# Patient Record
Sex: Female | Born: 1965
Health system: Southern US, Community
[De-identification: ages and names within clinical notes are randomized; demographics above are authoritative.]

## PROBLEM LIST (undated history)

## (undated) DIAGNOSIS — E669 Obesity, unspecified: Secondary | ICD-10-CM

## (undated) DIAGNOSIS — Z9889 Other specified postprocedural states: Secondary | ICD-10-CM

## (undated) DIAGNOSIS — I10 Essential (primary) hypertension: Secondary | ICD-10-CM

## (undated) DIAGNOSIS — H269 Unspecified cataract: Secondary | ICD-10-CM

## (undated) DIAGNOSIS — I5189 Other ill-defined heart diseases: Secondary | ICD-10-CM

## (undated) DIAGNOSIS — E785 Hyperlipidemia, unspecified: Secondary | ICD-10-CM

## (undated) DIAGNOSIS — N921 Excessive and frequent menstruation with irregular cycle: Secondary | ICD-10-CM

## (undated) HISTORY — DX: Excessive and frequent menstruation with irregular cycle: N92.1

## (undated) HISTORY — DX: Hyperlipidemia, unspecified: E78.5

## (undated) HISTORY — DX: Other specified postprocedural states: Z98.890

## (undated) HISTORY — DX: Essential (primary) hypertension: I10

## (undated) HISTORY — DX: Other ill-defined heart diseases: I51.89

## (undated) HISTORY — DX: Obesity, unspecified: E66.9

## (undated) HISTORY — PX: WISDOM TOOTH EXTRACTION: SHX21

## (undated) HISTORY — PX: ABDOMINAL HYSTERECTOMY: SHX81

## (undated) HISTORY — DX: Unspecified cataract: H26.9

## (undated) HISTORY — PX: COLONOSCOPY: SHX174

---

## 1990-05-15 HISTORY — PX: TUBAL LIGATION: SHX77

## 1998-06-14 ENCOUNTER — Other Ambulatory Visit: Admission: RE | Admit: 1998-06-14 | Discharge: 1998-06-14 | Payer: Self-pay | Admitting: Obstetrics and Gynecology

## 1999-01-03 ENCOUNTER — Inpatient Hospital Stay (HOSPITAL_COMMUNITY): Admission: AD | Admit: 1999-01-03 | Discharge: 1999-01-06 | Payer: Self-pay | Admitting: Obstetrics and Gynecology

## 1999-01-03 ENCOUNTER — Encounter (INDEPENDENT_AMBULATORY_CARE_PROVIDER_SITE_OTHER): Payer: Self-pay

## 2001-05-20 ENCOUNTER — Other Ambulatory Visit: Admission: RE | Admit: 2001-05-20 | Discharge: 2001-05-20 | Payer: Self-pay | Admitting: Obstetrics and Gynecology

## 2002-06-24 ENCOUNTER — Other Ambulatory Visit: Admission: RE | Admit: 2002-06-24 | Discharge: 2002-06-24 | Payer: Self-pay | Admitting: Obstetrics and Gynecology

## 2003-07-17 ENCOUNTER — Other Ambulatory Visit: Admission: RE | Admit: 2003-07-17 | Discharge: 2003-07-17 | Payer: Self-pay | Admitting: Obstetrics and Gynecology

## 2004-07-21 ENCOUNTER — Other Ambulatory Visit: Admission: RE | Admit: 2004-07-21 | Discharge: 2004-07-21 | Payer: Self-pay | Admitting: Obstetrics and Gynecology

## 2005-08-14 ENCOUNTER — Other Ambulatory Visit: Admission: RE | Admit: 2005-08-14 | Discharge: 2005-08-14 | Payer: Self-pay | Admitting: Obstetrics and Gynecology

## 2010-01-12 ENCOUNTER — Ambulatory Visit (HOSPITAL_COMMUNITY): Admission: RE | Admit: 2010-01-12 | Discharge: 2010-01-13 | Payer: Self-pay | Admitting: Obstetrics and Gynecology

## 2010-01-12 ENCOUNTER — Encounter (INDEPENDENT_AMBULATORY_CARE_PROVIDER_SITE_OTHER): Payer: Self-pay | Admitting: Obstetrics and Gynecology

## 2010-01-27 ENCOUNTER — Inpatient Hospital Stay (HOSPITAL_COMMUNITY): Admission: AD | Admit: 2010-01-27 | Discharge: 2010-01-27 | Payer: Self-pay | Admitting: Obstetrics and Gynecology

## 2010-07-28 LAB — CBC
HCT: 41.4 % (ref 36.0–46.0)
MCH: 29.8 pg (ref 26.0–34.0)
MCV: 87.4 fL (ref 78.0–100.0)
MCV: 89.3 fL (ref 78.0–100.0)
Platelets: 196 10*3/uL (ref 150–400)
RBC: 4.74 MIL/uL (ref 3.87–5.11)
RDW: 12.5 % (ref 11.5–15.5)
WBC: 9.9 10*3/uL (ref 4.0–10.5)
WBC: 10.7 10*3/uL — ABNORMAL HIGH (ref 4.0–10.5)

## 2010-07-28 LAB — URINE MICROSCOPIC-ADD ON

## 2010-07-28 LAB — URINALYSIS, ROUTINE W REFLEX MICROSCOPIC
Nitrite: NEGATIVE
Protein, ur: NEGATIVE mg/dL
Urobilinogen, UA: 0.2 mg/dL (ref 0.0–1.0)

## 2010-07-28 LAB — URINE CULTURE
Colony Count: >=100000
Culture  Setup Time: 201109160142

## 2010-07-28 LAB — COMPREHENSIVE METABOLIC PANEL
Alkaline Phosphatase: 85 U/L (ref 39–117)
BUN: 10 mg/dL (ref 6–23)
CO2: 24 mEq/L (ref 19–32)
Chloride: 105 mEq/L (ref 96–112)
Creatinine, Ser: 0.86 mg/dL (ref 0.4–1.2)
GFR calc non Af Amer: >60 mL/min (ref >60)
Glucose, Bld: 93 mg/dL (ref 70–99)
Potassium: 3.4 mEq/L — ABNORMAL LOW (ref 3.5–5.1)
Total Bilirubin: 0.7 mg/dL (ref 0.3–1.2)

## 2010-07-29 LAB — SURGICAL PCR SCREEN
MRSA, PCR: NEGATIVE
Staphylococcus aureus: NEGATIVE

## 2010-07-29 LAB — CBC
Hemoglobin: 12.6 g/dL (ref 12.0–15.0)
MCHC: 34.2 g/dL (ref 30.0–36.0)
RDW: 12.5 % (ref 11.5–15.5)

## 2014-11-27 ENCOUNTER — Other Ambulatory Visit: Payer: Self-pay | Admitting: Obstetrics and Gynecology

## 2014-11-30 LAB — CYTOLOGY - PAP

## 2016-12-08 ENCOUNTER — Encounter: Payer: Self-pay | Admitting: Internal Medicine

## 2016-12-18 ENCOUNTER — Telehealth: Payer: Self-pay | Admitting: Physician Assistant

## 2016-12-18 ENCOUNTER — Encounter: Payer: Self-pay | Admitting: Physician Assistant

## 2016-12-18 NOTE — Telephone Encounter (Signed)
Called pt and left message asking pt to call back to update Fm and Medical Hx.

## 2016-12-19 ENCOUNTER — Encounter: Payer: Self-pay | Admitting: Physician Assistant

## 2017-01-02 ENCOUNTER — Encounter: Payer: Self-pay | Admitting: Physician Assistant

## 2017-01-02 NOTE — Progress Notes (Signed)
Cardiology Office Note    Date:  01/04/2017  ID:  Dawn Daniels, Dawn Daniels 1965-09-15, MRN [id number] PCP:  Donald Prose, MD  Cardiologist:  New, reviewed with Dr. Saunders Revel   Chief Complaint: chest discomfort  History of Present Illness:  Dawn Daniels is a 51 y.o. female with history of menometrorrhagia s/p hysterectomy, hyperlipidemia, obesity who is being seen today for the evaluation of chest pain at the request of Dr. Gaetano Net.  She has no prior cardiac history or family history of heart disease (although father's side is unknown). She smoked for 2 years as a teenager. She has been having this discomfort for about a year and a half. She was previously referred to cardiology last year but she deferred this due to busy schedule with her husband having surgery at the time. During this last year and a half, she has noticed that when she exerts herself, she feels a sensation of chest burning on the left side of her chest which she describes as a sensation of fluid moving up and down, as if it is rattling. This happens fairly early in exercise, essentially any time she gets her heart rate up. She's decreased her activity level due to this. The sensation improves with rest. She has not felt it at rest, with inspiration, anxiety, meals or lying down. She's not felt any palpitations. A month ago she had 3 weeks of worsening dyspnea in the setting of increased stress at work but that has resolved. No syncope, LEE, dizziness, bleeding. She has an Apple watch but has not paid particular attention to HR during activity. BP is running elevated today. It was 128/86 at recent OB appt. I do not have formal records but POC report says that labs in 12/2015 showed Tchol 248, trig 158, HDL 52, LDL 163, Cr 0.91, K 4.0, TSH 1.530.   Past Medical History:  Diagnosis Date  . Hyperlipidemia   . Menometrorrhagia    a. s/p hysterectomy.  . Obesity     Past Surgical History:  Procedure Laterality Date  . ABDOMINAL  HYSTERECTOMY    . CESAREAN SECTION  1999, 2000    Current Medications: Current Meds  Medication Sig  . Boswellia-Glucosamine-Vit D (GLUCOSAMINE COMPLEX PO) Take 2 tablets by mouth daily.  . TURMERIC PO Take 1 capsule by mouth daily.     Allergies:   Patient has no known allergies.   Social History   Social History  . Marital status: Married    Spouse name: N/A  . Number of children: 2  . Years of education: 12   Occupational History  . [employer]     Social History Main Topics  . Smoking status: Former Research scientist (life sciences)  . Smokeless tobacco: Never Used     Comment: Smoked for 2 years as a teenager  . Alcohol use No  . Drug use: No  . Sexual activity: Not Asked   Other Topics Concern  . None   Social History Narrative  . None     Family History:  Family History  Problem Relation Age of Onset  . Hypertension Mother   . Other Father        Father's history not known  . CAD Neg Hx     ROS:   Please see the history of present illness.  All other systems are reviewed and otherwise negative.    PHYSICAL EXAM:   VS:  BP (!) 150/94   Pulse 63   Ht 5' 6.5" (1.689 m)  Wt 198 lb (89.8 kg)   SpO2 96%   BMI 31.48 kg/m   BMI: Body mass index is 31.48 kg/m. GEN: Well nourished, well developed WF, in no acute distress  HEENT: normocephalic, atraumatic Neck: no JVD, carotid bruits, or masses Cardiac: RRR; no murmurs, rubs, or gallops, no edema  Respiratory:  clear to auscultation bilaterally, normal work of breathing GI: soft, nontender, nondistended, + BS MS: no deformity or atrophy  Skin: warm and dry, no rash Neuro:  Alert and Oriented x 3, Strength and sensation are intact, follows commands Psych: euthymic mood, full affect  Wt Readings from Last 3 Encounters:  01/04/17 198 lb (89.8 kg)      Studies/Labs Reviewed:   EKG:  EKG was ordered today and personally reviewed by me and demonstrates NSR 63bpm, TWI III, otherwise normal  Recent Labs: No results  found for requested labs within last 8760 hours.   Lipid Panel No results found for: CHOL, TRIG, HDL, CHOLHDL, VLDL, LDLCALC, LDLDIRECT  Additional studies/ records that were reviewed today include: Summarized above    ASSESSMENT & PLAN:   This patient's case was discussed in depth with Dr. Saunders Revel. The plan below was formulated per our discussion.  1. Chest discomfort - somewhat atypical in quality, but also with a burning characteristic occurring solely with exertion. Will plan on stress echocardiogram to evaluate. This will also allow [place] to assess blood pressure response to exercise and also rule out any exercise induced arrhythmias. Check CBC to exclude any anemia contributing to angina. Start ASA 81mg  daily as preventative measure. We discussed holding off on more strenuous activity for now while the above is evaluated. ER precautions reviewed. 2. Hyperlipidemia - being followed by PCP/OB for now. If stress test is abnormal, would consider initiation of statin. Will add baseline LFTs to labs in case this is warranted. 3. Elevated blood pressure reading in office without prior diagnosis of HTN - check baseline lytes/Cr in case this will require treatment. Check screening thyroid function. Per d/w MD, will follow-up BP when she comes in for her stress test to determine whether therapy needs to be initiated.  4. Obesity - she is aware she is overweight. Once cleared from a cardiac perspective, would encourage her to get back into regular exercise program. She used to go to the gym but has not been in the last year.  Disposition: F/u with myself after above testing.   Medication Adjustments/Labs and Tests Ordered: Current medicines are reviewed at length with the patient today.  Concerns regarding medicines are outlined above. Medication changes, Labs and Tests ordered today are summarized above and listed in the Patient Instructions accessible in Encounters.   Signed, Charlie Pitter, PA-C    01/04/2017 [facility] [place], [place], [place]  [address] Phone: [phone]; Fax: [phone]

## 2017-01-04 ENCOUNTER — Ambulatory Visit (INDEPENDENT_AMBULATORY_CARE_PROVIDER_SITE_OTHER): Payer: BLUE CROSS/BLUE SHIELD | Admitting: Physician Assistant

## 2017-01-04 ENCOUNTER — Encounter: Payer: Self-pay | Admitting: Physician Assistant

## 2017-01-04 ENCOUNTER — Encounter (INDEPENDENT_AMBULATORY_CARE_PROVIDER_SITE_OTHER): Payer: Self-pay

## 2017-01-04 VITALS — BP 150/94 | HR 63 | Ht 66.5 in | Wt 198.0 lb

## 2017-01-04 DIAGNOSIS — R0789 Other chest pain: Secondary | ICD-10-CM | POA: Diagnosis not present

## 2017-01-04 DIAGNOSIS — E669 Obesity, unspecified: Secondary | ICD-10-CM | POA: Diagnosis not present

## 2017-01-04 DIAGNOSIS — R030 Elevated blood-pressure reading, without diagnosis of hypertension: Secondary | ICD-10-CM

## 2017-01-04 DIAGNOSIS — E785 Hyperlipidemia, unspecified: Secondary | ICD-10-CM | POA: Diagnosis not present

## 2017-01-04 MED ORDER — ASPIRIN EC 81 MG PO TBEC: 81.0000 mg | DELAYED_RELEASE_TABLET | Freq: Every day | ORAL | 3 refills | Status: DC

## 2017-01-04 NOTE — Patient Instructions (Signed)
Medication Instructions:  Your physician has recommended you make the following change in your medication:  1.  START Aspirin 81 mg daily   Labwork: TODAY:  BMET, CBC, & TSH  Testing/Procedures: Your physician has requested that you have a stress echocardiogram. For further information please visit [email or web address]. Please follow instruction sheet as given.    Follow-Up: Your physician recommends that you schedule a follow-up appointment in: 1 WEEK AFTER ECHO IS DONE WITH DAYNA DUNN, PA-C   Any Other Special Instructions Will Be Listed Below (If Applicable).   Exercise Stress Echocardiogram An exercise stress echocardiogram is a test to check how well your heart is working. This test uses sound waves (ultrasound) and a computer to make images of your heart before and after exercise. Ultrasound images that are taken before you exercise (your resting echocardiogram) will show how much blood is getting to your heart muscle and how well your heart muscle and heart valves are functioning. During the next part of this test, you will walk on a treadmill or ride a stationary bike to see how exercise affects your heart. While you exercise, the electrical activity of your heart will be monitored with an electrocardiogram (ECG). Your blood pressure will also be monitored. You may have this test if you:  Have chest pain or other symptoms of a heart problem.  Recently had a heart attack or heart surgery.  Have heart valve problems.  Have a condition that causes narrowing of the blood vessels that supply your heart (coronary artery disease).  Have a high risk of heart disease and are starting a new exercise program.  Have a high risk of heart disease and need to have major surgery.  Tell a health care provider about:  Any allergies you have.  All medicines you are taking, including vitamins, herbs, eye drops, creams, and over-the-counter medicines.  Any problems you or family members  have had with anesthetic medicines.  Any blood disorders you have.  Any surgeries you have had.  Any medical conditions you have.  Whether you are pregnant or may be pregnant. What are the risks? Generally, this is a safe procedure. However, problems may occur, including:  Chest pain.  Dizziness or light-headedness.  Shortness of breath.  Increased or irregular heartbeat (palpitations).  Nausea or vomiting.  Heart attack (very rare).  What happens before the procedure?  Follow instructions from your health care provider about eating or drinking restrictions. You may be asked to avoid all forms of caffeine for 24 hours before your procedure, or as told by your health care provider.  Ask your health care provider about changing or stopping your regular medicines. This is especially important if you are taking diabetes medicines or blood thinners.  If you use an inhaler, bring it with you to the test.  Wear loose, comfortable clothing and walking shoes.  Do notuse any products that contain nicotine or tobacco, such as cigarettes and e-cigarettes, for 4 hours before the test or as told by your health care provider. If you need help quitting, ask your health care provider. What happens during the procedure?  You will take off your clothes from the waist up and put on a hospital gown.  A technician will place electrodes on your chest.  A blood pressure cuff will be placed on your arm.  You will lie down on a table for an ultrasound exam before you exercise. Gel will be rubbed on your chest, and a handheld device (transducer) will be pressed  against your chest and moved over your heart.  Then, you will start exercising by walking on a treadmill or pedaling a stationary bicycle.  Your blood pressure and heart rhythm will be monitored while you exercise.  The exercise will gradually get harder or faster.  You will exercise until: ? Your heart reaches a target level. ? You  are too tired to continue. ? You cannot continue because of chest pain, weakness, or dizziness.  You will have another ultrasound exam after you stop exercising. The procedure may vary among health care providers and hospitals. What happens after the procedure?  Your heart rate and blood pressure will be monitored until they return to your normal levels. Summary  An exercise stress echocardiogram is a test that uses ultrasound to check how well your heart works before and after exercise.  Before the test, follow instructions from your health care provider about stopping medications, avoiding nicotine and tobacco, and avoiding certain foods and drinks.  During the test, your blood pressure and heart rhythm will be monitored while you exercise on a treadmill or stationary bicycle. This information is not intended to replace advice given to you by your health care provider. Make sure you discuss any questions you have with your health care provider. Document Released: 05/05/2004 Document Revised: 12/22/2015 Document Reviewed: 12/22/2015 Elsevier Interactive Patient Education  [employer].    If you need a refill on your cardiac medications before your next appointment, please call your pharmacy.

## 2017-01-05 ENCOUNTER — Telehealth: Payer: Self-pay | Admitting: Physician Assistant

## 2017-01-05 LAB — BASIC METABOLIC PANEL
BUN/Creatinine Ratio: 18 (ref 9–23)
BUN: 16 mg/dL (ref 6–24)
CALCIUM: 9.6 mg/dL (ref 8.7–10.2)
CHLORIDE: 103 mmol/L (ref 96–106)
CO2: 24 mmol/L (ref 20–29)
CREATININE: 0.88 mg/dL (ref 0.57–1.00)
GFR calc Af Amer: 89 mL/min/{1.73_m2} (ref >59)
GFR calc non Af Amer: 77 mL/min/{1.73_m2} (ref >59)
GLUCOSE: 87 mg/dL (ref 65–99)
Potassium: 3.9 mmol/L (ref 3.5–5.2)
Sodium: 143 mmol/L (ref 134–144)

## 2017-01-05 LAB — TSH: TSH: 2.12 u[IU]/mL (ref 0.450–4.500)

## 2017-01-05 LAB — CBC
HEMATOCRIT: 41.3 % (ref 34.0–46.6)
HEMOGLOBIN: 13.8 g/dL (ref 11.1–15.9)
MCH: 29.1 pg (ref 26.6–33.0)
MCHC: 33.4 g/dL (ref 31.5–35.7)
MCV: 87 fL (ref 79–97)
Platelets: 281 10*3/uL (ref 150–379)
RBC: 4.74 x10E6/uL (ref 3.77–5.28)
RDW: 12.2 % — ABNORMAL LOW (ref 12.3–15.4)
WBC: 8.2 10*3/uL (ref 3.4–10.8)

## 2017-01-05 NOTE — Telephone Encounter (Signed)
Returned pts call and she has been made aware of her lab results. 

## 2017-01-05 NOTE — Telephone Encounter (Signed)
-----   Message from Charlie Pitter, [place] sent at 01/05/2017  8:54 AM EDT ----- Please let patient know labs were normal. Melina Copa PA-C

## 2017-01-05 NOTE — Telephone Encounter (Signed)
New Message     Pt returning [place] call for lab results

## 2017-01-17 ENCOUNTER — Telehealth: Payer: Self-pay | Admitting: *Deleted

## 2017-01-17 NOTE — Telephone Encounter (Signed)
Pt is scheduled for PV 9/18 and colonoscopy 10/2 with Dr Hilarie Fredrickson.  She has stress echocardiogram scheduled for 9/26.  Talked with patient; she will cancel colonoscopy and PV and reshcedule after results of echo have been reviewed by cardiologist and she has clearance from him

## 2017-02-01 ENCOUNTER — Telehealth (HOSPITAL_COMMUNITY): Payer: Self-pay | Admitting: *Deleted

## 2017-02-01 NOTE — Telephone Encounter (Signed)
Left message on voicemail per DPR in reference to upcoming appointment scheduled on 02/08/17 at 7:30 with detailed instructions given per Stress Test Requisition Sheet for the test. LM to arrive 30 minutes early, and that it is imperative to arrive on time for appointment to keep from having the test rescheduled. If you need to cancel or reschedule your appointment, please call the office within 24 hours of your appointment. Failure to do so may result in a cancellation of your appointment, and a $50 no show fee. Phone number given for call back for any questions. Dawn Daniels

## 2017-02-07 ENCOUNTER — Other Ambulatory Visit (HOSPITAL_COMMUNITY): Payer: BLUE CROSS/BLUE SHIELD

## 2017-02-07 ENCOUNTER — Ambulatory Visit (HOSPITAL_COMMUNITY): Payer: BLUE CROSS/BLUE SHIELD | Attending: Internal Medicine

## 2017-02-07 ENCOUNTER — Other Ambulatory Visit: Payer: Self-pay

## 2017-02-07 ENCOUNTER — Telehealth: Payer: Self-pay | Admitting: *Deleted

## 2017-02-07 DIAGNOSIS — R0789 Other chest pain: Secondary | ICD-10-CM | POA: Insufficient documentation

## 2017-02-07 DIAGNOSIS — I5030 Unspecified diastolic (congestive) heart failure: Secondary | ICD-10-CM | POA: Insufficient documentation

## 2017-02-07 NOTE — Telephone Encounter (Signed)
Echo scheduler requested for an order to be placed for this pt.  Pt was originally scheduled to have a stress echo done today, but insurance will not cover this, without an echo done first. Echo order placed for chest discomfort.  Pt will come as scheduled today, for echo appt. Melina Copa PA-C is ordering Provider.

## 2017-02-08 ENCOUNTER — Encounter: Payer: Self-pay | Admitting: Physician Assistant

## 2017-02-08 ENCOUNTER — Telehealth (HOSPITAL_COMMUNITY): Payer: Self-pay | Admitting: *Deleted

## 2017-02-08 NOTE — Telephone Encounter (Signed)
Patient given detailed instructions per Stress Test Requisition Sheet for test on 02/15/17 at 7:30.Patient Notified to arrive 30 minutes early, and that it is imperative to arrive on time for appointment to keep from having the test rescheduled.  Patient verbalized understanding. Dawn Daniels

## 2017-02-13 ENCOUNTER — Encounter: Payer: Self-pay | Admitting: Internal Medicine

## 2017-02-13 ENCOUNTER — Ambulatory Visit: Payer: BLUE CROSS/BLUE SHIELD | Admitting: Physician Assistant

## 2017-02-15 ENCOUNTER — Ambulatory Visit (HOSPITAL_BASED_OUTPATIENT_CLINIC_OR_DEPARTMENT_OTHER): Payer: BLUE CROSS/BLUE SHIELD

## 2017-02-15 ENCOUNTER — Ambulatory Visit (HOSPITAL_COMMUNITY): Payer: BLUE CROSS/BLUE SHIELD | Attending: Cardiology

## 2017-02-15 ENCOUNTER — Telehealth: Payer: Self-pay | Admitting: *Deleted

## 2017-02-15 DIAGNOSIS — R5383 Other fatigue: Secondary | ICD-10-CM | POA: Diagnosis not present

## 2017-02-15 DIAGNOSIS — R0789 Other chest pain: Secondary | ICD-10-CM

## 2017-02-15 DIAGNOSIS — E785 Hyperlipidemia, unspecified: Secondary | ICD-10-CM | POA: Diagnosis not present

## 2017-02-15 MED ORDER — AMLODIPINE BESYLATE 5 MG PO TABS: 5.0000 mg | ORAL_TABLET | Freq: Every day | ORAL | 3 refills | Status: DC

## 2017-02-15 NOTE — Telephone Encounter (Signed)
Called pt re: stress test results. See result note.

## 2017-02-15 NOTE — Telephone Encounter (Signed)
-----   Message from Charlie Pitter, [place] sent at 02/15/2017  1:37 PM EDT ----- Please let patient know stress test was normal. Her baseline BP was elevated when she presented for testing and BP went up to the 200 range with exercise, suggesting we should treat her BP. Recommend initiation of amlodipine 5mg  daily. Would advise she get a BP cuff for home and follow at least several times a week and bring log of BP into office visit. If she notices readings >135/85 on a regular basis before then, should call our office. ThxLisbeth Renshaw Dunn PA-C

## 2017-02-22 ENCOUNTER — Encounter: Payer: Self-pay | Admitting: Obstetrics and Gynecology

## 2017-02-26 ENCOUNTER — Encounter: Payer: Self-pay | Admitting: Physician Assistant

## 2017-02-26 DIAGNOSIS — I5189 Other ill-defined heart diseases: Secondary | ICD-10-CM | POA: Insufficient documentation

## 2017-02-26 DIAGNOSIS — I10 Essential (primary) hypertension: Secondary | ICD-10-CM | POA: Insufficient documentation

## 2017-02-26 NOTE — Progress Notes (Signed)
Cardiology Office Note    Date:  02/27/2017  ID:  Dawn Daniels, Dawn Daniels 30-Sep-1965, MRN [id number] PCP:  Donald Prose, MD  Cardiologist: Reviewed with Dr. Saunders Revel last visit   Chief Complaint: f/u chest discomfort  History of Present Illness:  Dawn Daniels is a 51 y.o. female with history of menometrorrhagia s/p hysterectomy, hyperlipidemia, brief tobacco use (2 years), obesity who presents for follow-up of chest discomfort. She was previously referred to cardiology last year but she deferred this due to busy schedule with her husband having surgery at the time. During this last year and a half, she has noticed that when she exerts herself, she feels a sensation of chest discomfort on the left side of her chest which she describes as a sensation of fluid moving up and down, as if it is rattling. The sensation improves with rest. It does not happen every time she exerts herself. She had not felt it at rest, with inspiration, anxiety, meals or lying down. 2D Echo 02/07/17 showed mild focal basal hypertrophy of the septum, EF 60-65%, grade 1 DD. Subsequent stress echo was normal. She exercised for 10 minutes without any chest pain or dyspnea, reaching 11.6 METS in stage 4. She was hypertensive recently also with hypertensive response to exercise thus amlodipine 5mg  daily was suggested. Otherwise recent labs showed normal CBC, BMET, TSH. PCP labs in 12/2015 showed Tchol 248, trig 158, HDL 52, LDL 163. No family history of CAD.  She returns for follow-up overall feeling well. She has only had one episode of the same rattling chest discomfort while hiking a few weeks ago. She had abruptly increased her activity level at that time rather than easing into it. This occurred before she started amlodipine. She's not had any symptoms since then. She notes that she felt perfectly fine while pushing herself gradually on the treadmill. She denies any diaphoresis, presyncope, syncope, LEE or dyspnea.   Past  Medical History:  Diagnosis Date  . Diastolic dysfunction without heart failure   . Essential hypertension   . Hyperlipidemia   . Menometrorrhagia    a. s/p hysterectomy.  . Obesity     Past Surgical History:  Procedure Laterality Date  . ABDOMINAL HYSTERECTOMY    . CESAREAN SECTION  1999, 2000    Current Medications: Current Meds  Medication Sig  . amLODipine (NORVASC) 5 MG tablet Take 1 tablet (5 mg total) by mouth daily.  Marland Kitchen aspirin EC 81 MG tablet Take 1 tablet (81 mg total) by mouth daily.  . Boswellia-Glucosamine-Vit D (GLUCOSAMINE COMPLEX PO) Take 2 tablets by mouth daily.  . TURMERIC PO Take 1 capsule by mouth daily.     Allergies:   Patient has no known allergies.   Social History   Social History  . Marital status: Married    Spouse name: N/A  . Number of children: 2  . Years of education: 12   Occupational History  . [employer]     Social History Main Topics  . Smoking status: Former Research scientist (life sciences)  . Smokeless tobacco: Never Used     Comment: Smoked for 2 years as a teenager  . Alcohol use No  . Drug use: No  . Sexual activity: Not Asked   Other Topics Concern  . None   Social History Narrative  . None     Family History:  Family History  Problem Relation Age of Onset  . Hypertension Mother   . Other Father  Father's history not known  . CAD Neg Hx     ROS:   Please see the history of present illness.  All other systems are reviewed and otherwise negative.    PHYSICAL EXAM:   VS:  BP 132/80   Pulse 70   Ht 5' 6.5" (1.689 m)   Wt 199 lb 12.8 oz (90.6 kg)   SpO2 99%   BMI 31.77 kg/m   BMI: Body mass index is 31.77 kg/m. GEN: Well nourished, well developed WF in no acute distress  HEENT: normocephalic, atraumatic Neck: no JVD, carotid bruits, or masses Cardiac: RRR; no murmurs, rubs, or gallops, no edema  Respiratory:  clear to auscultation bilaterally, normal work of breathing GI: soft, nontender, nondistended, + BS MS: no  deformity or atrophy  Skin: warm and dry, no rash Neuro:  Alert and Oriented x 3, Strength and sensation are intact, follows commands Psych: euthymic mood, full affect  Wt Readings from Last 3 Encounters:  02/27/17 199 lb 12.8 oz (90.6 kg)  01/04/17 198 lb (89.8 kg)      Studies/Labs Reviewed:   EKG:   EKG was not ordered today.  Recent Labs: 01/04/2017: BUN 16; Creatinine, Ser 0.88; Hemoglobin 13.8; Platelets 281; Potassium 3.9; Sodium 143; TSH 2.120   Lipid Panel No results found for: CHOL, TRIG, HDL, CHOLHDL, VLDL, LDLCALC, LDLDIRECT  Additional studies/ records that were reviewed today include: Summarized above.   ASSESSMENT & PLAN:   1. Chest discomfort - generally atypical except for exertional component. Recent stress echo reassuring without any significant angina despite 11 METS and 10 minutes of exercise. She had only one recurrent episode between now and last visit, prior to starting amlodipine. Question whether this has been related to blood pressure elevation or deconditioning. Continue amlodipine and aspirin. I asked patient to gradually increase activity and notify us of any recurrent symptoms whatsoever, at which time would need to review further workup with Dr. Saunders Revel. Warning sx discussed with patient. Continue risk factor modification. 2. Essential HTN - improved on amlodipine. Asked patient to monitor BP at home and call if tending to run >672 systolic or >09 diastolic. Also discussed importance of regular physical exercise and sodium reduction. 3. Diastolic dysfunction without heart failure - discussed long-term control of blood pressure, sodium reduction and observation for any progressive symptoms. 4. Hyperlipidemia - this is followed by primary care. She reports poor diet recently and seems motivated for change. I advised she f/u with PCP to further discuss plan for monitoring this to further reduce CV risk.  Disposition: F/u with Dr. Saunders Revel in 6 months.  Medication  Adjustments/Labs and Tests Ordered: Current medicines are reviewed at length with the patient today.  Concerns regarding medicines are outlined above. Medication changes, Labs and Tests ordered today are summarized above and listed in the Patient Instructions accessible in Encounters.   Signed, Charlie Pitter, PA-C  02/27/2017 8:34 AM    [address] [address], [place], [place]  [address] Phone: [phone]; Fax: [phone]

## 2017-02-27 ENCOUNTER — Encounter: Payer: Self-pay | Admitting: Physician Assistant

## 2017-02-27 ENCOUNTER — Ambulatory Visit (INDEPENDENT_AMBULATORY_CARE_PROVIDER_SITE_OTHER): Payer: BLUE CROSS/BLUE SHIELD | Admitting: Physician Assistant

## 2017-02-27 VITALS — BP 132/80 | HR 70 | Ht 66.5 in | Wt 199.8 lb

## 2017-02-27 DIAGNOSIS — E785 Hyperlipidemia, unspecified: Secondary | ICD-10-CM | POA: Diagnosis not present

## 2017-02-27 DIAGNOSIS — I5189 Other ill-defined heart diseases: Secondary | ICD-10-CM | POA: Diagnosis not present

## 2017-02-27 DIAGNOSIS — I10 Essential (primary) hypertension: Secondary | ICD-10-CM | POA: Diagnosis not present

## 2017-02-27 DIAGNOSIS — R0789 Other chest pain: Secondary | ICD-10-CM | POA: Diagnosis not present

## 2017-02-27 NOTE — Patient Instructions (Addendum)
Medication Instructions:  Your physician recommends that you continue on your current medications as directed. Please refer to the Current Medication list given to you today.   Labwork: None ordered  Testing/Procedures: None ordered  Follow-Up: Your physician wants you to follow-up in: 6 months with Dr. Saunders Revel. You will receive a reminder letter in the mail two months in advance. If you don't receive a letter, please call our office to schedule the follow-up appointment.   Any Other Special Instructions Will Be Listed Below (If Applicable).  Please monitor your blood pressure occasionally at home. Call your doctor if you tend to get readings of greater than 130 on the top number or 80 on the bottom number.   If you need a refill on your cardiac medications before your next appointment, please call your pharmacy.

## 2017-03-06 ENCOUNTER — Telehealth: Payer: Self-pay | Admitting: Internal Medicine

## 2017-03-06 NOTE — Telephone Encounter (Signed)
patient was told that she would need to see cardiologist before rescheduling procedure. patient was seen by cardiologist and says that she was given the clearance to have colonoscopy. Does our office need to contact [facility] before scheduling? Thanks.

## 2017-03-06 NOTE — Telephone Encounter (Signed)
I reviewed her cardiology note Okay to schedule colonoscopy in [place]

## 2017-03-07 NOTE — Telephone Encounter (Signed)
Left message for patient to return my call.

## 2017-03-08 ENCOUNTER — Encounter: Payer: Self-pay | Admitting: Internal Medicine

## 2017-04-11 ENCOUNTER — Ambulatory Visit (AMBULATORY_SURGERY_CENTER): Payer: Self-pay | Admitting: *Deleted

## 2017-04-11 ENCOUNTER — Other Ambulatory Visit: Payer: Self-pay

## 2017-04-11 VITALS — Ht 66.5 in | Wt 199.0 lb

## 2017-04-11 DIAGNOSIS — Z1211 Encounter for screening for malignant neoplasm of colon: Secondary | ICD-10-CM

## 2017-04-11 MED ORDER — NA SULFATE-K SULFATE-MG SULF 17.5-3.13-1.6 GM/177ML PO SOLN: 1.0000 | Freq: Once | ORAL | 0 refills | Status: AC

## 2017-04-11 NOTE — Progress Notes (Signed)
No egg or soy allergy known to patient  issues with past sedation with any surgeries or procedures except for PONV, no intubation problems  No diet pills per patient No home 02 use per patient  No blood thinners per patient  Pt denies issues with constipation  No A fib or A flutter  EMMI video sent to pt's e mail - $15 coupon for suprep to pt in PV today

## 2017-04-18 ENCOUNTER — Encounter: Payer: Self-pay | Admitting: Internal Medicine

## 2017-04-23 ENCOUNTER — Encounter: Payer: BLUE CROSS/BLUE SHIELD | Admitting: Internal Medicine

## 2017-05-22 ENCOUNTER — Encounter: Payer: Self-pay | Admitting: Internal Medicine

## 2017-05-22 ENCOUNTER — Ambulatory Visit (AMBULATORY_SURGERY_CENTER): Payer: BLUE CROSS/BLUE SHIELD | Admitting: Internal Medicine

## 2017-05-22 ENCOUNTER — Other Ambulatory Visit: Payer: Self-pay

## 2017-05-22 VITALS — BP 130/68 | HR 59 | Temp 98.9°F | Resp 12 | Ht 66.0 in | Wt 199.0 lb

## 2017-05-22 DIAGNOSIS — Z1211 Encounter for screening for malignant neoplasm of colon: Secondary | ICD-10-CM | POA: Diagnosis not present

## 2017-05-22 DIAGNOSIS — D123 Benign neoplasm of transverse colon: Secondary | ICD-10-CM | POA: Diagnosis not present

## 2017-05-22 DIAGNOSIS — Z1212 Encounter for screening for malignant neoplasm of rectum: Secondary | ICD-10-CM

## 2017-05-22 MED ORDER — SODIUM CHLORIDE 0.9 % IV SOLN: 500.0000 mL | Freq: Once | INTRAVENOUS | Status: AC

## 2017-05-22 NOTE — Progress Notes (Signed)
Called to room to assist during endoscopic procedure.  Patient ID and intended procedure confirmed with present staff. Received instructions for my participation in the procedure from the performing physician.  

## 2017-05-22 NOTE — Patient Instructions (Signed)
Handout given on polyps  YOU HAD AN ENDOSCOPIC PROCEDURE TODAY: Refer to the procedure report and other information in the discharge instructions given to you for any specific questions about what was found during the examination. If this information does not answer your questions, please call [facility] office at [phone] to clarify.   YOU SHOULD EXPECT: Some feelings of bloating in the abdomen. Passage of more gas than usual. Walking can help get rid of the air that was put into your GI tract during the procedure and reduce the bloating. If you had a lower endoscopy (such as a colonoscopy or flexible sigmoidoscopy) you may notice spotting of blood in your stool or on the toilet paper. Some abdominal soreness may be present for a day or two, also.  DIET: Your first meal following the procedure should be a light meal and then it is ok to progress to your normal diet. A half-sandwich or bowl of soup is an example of a good first meal. Heavy or fried foods are harder to digest and may make you feel nauseous or bloated. Drink plenty of fluids but you should avoid alcoholic beverages for 24 hours. If you had a esophageal dilation, please see attached instructions for diet.    ACTIVITY: Your care partner should take you home directly after the procedure. You should plan to take it easy, moving slowly for the rest of the day. You can resume normal activity the day after the procedure however YOU SHOULD NOT DRIVE, use power tools, machinery or perform tasks that involve climbing or major physical exertion for 24 hours (because of the sedation medicines used during the test).   SYMPTOMS TO REPORT IMMEDIATELY: A gastroenterologist can be reached at any hour. Please call [phone]  for any of the following symptoms:  Following lower endoscopy (colonoscopy, flexible sigmoidoscopy) Excessive amounts of blood in the stool  Significant tenderness, worsening of abdominal pains  Swelling of the abdomen that is  new, acute  Fever of 100 or higher    FOLLOW UP:  If any biopsies were taken you will be contacted by phone or by letter within the next 1-3 weeks. Call [phone]  if you have not heard about the biopsies in 3 weeks.  Please also call with any specific questions about appointments or follow up tests.  

## 2017-05-22 NOTE — Op Note (Signed)
[facility] Patient Name: Dawn Daniels Procedure Date: 05/22/2017 3:30 PM MRN: [id number] Endoscopist: Jerene Bears , MD Age: 52 Referring MD:  Date of Birth: November 05, 1965 Gender: Female Account #: [id number] Procedure:                Colonoscopy Indications:              Screening for colorectal malignant neoplasm, This                            is the patient's first colonoscopy Medicines:                Monitored Anesthesia Care Procedure:                Pre-Anesthesia Assessment:                           - Prior to the procedure, a History and Physical                            was performed, and patient medications and                            allergies were reviewed. The patient's tolerance of                            previous anesthesia was also reviewed. The risks                            and benefits of the procedure and the sedation                            options and risks were discussed with the patient.                            All questions were answered, and informed consent                            was obtained. Prior Anticoagulants: The patient has                            taken no previous anticoagulant or antiplatelet                            agents. ASA Grade Assessment: II - A patient with                            mild systemic disease. After reviewing the risks                            and benefits, the patient was deemed in                            satisfactory condition to undergo the procedure.  After obtaining informed consent, the colonoscope                            was passed under direct vision. Throughout the                            procedure, the patient's blood pressure, pulse, and                            oxygen saturations were monitored continuously. The                            Colonoscope was introduced through the anus and                            advanced to the the cecum,  identified by                            appendiceal orifice and ileocecal valve. The                            colonoscopy was performed without difficulty. The                            patient tolerated the procedure well. The quality                            of the bowel preparation was good. The ileocecal                            valve, appendiceal orifice, and rectum were                            photographed. Scope In: 3:45:22 PM Scope Out: 3:58:19 PM Scope Withdrawal Time: 0 hours 10 minutes 30 seconds  Total Procedure Duration: 0 hours 12 minutes 57 seconds  Findings:                 The digital rectal exam was normal.                           The terminal ileum appeared normal.                           A 4 mm polyp was found in the hepatic flexure. The                            polyp was sessile. The polyp was removed with a                            cold snare. Resection and retrieval were complete.                           A few small-mouthed diverticula were found in the  hepatic flexure.                           The exam was otherwise without abnormality on                            direct and retroflexion views. Complications:            No immediate complications. Estimated Blood Loss:     Estimated blood loss: none. Impression:               - The examined portion of the ileum was normal.                           - One 4 mm polyp at the hepatic flexure, removed                            with a cold snare. Resected and retrieved.                           - Diverticulosis at the hepatic flexure.                           - The examination was otherwise normal on direct                            and retroflexion views. Recommendation:           - Patient has a contact number available for                            emergencies. The signs and symptoms of potential                            delayed complications were discussed  with the                            patient. Return to normal activities tomorrow.                            Written discharge instructions were provided to the                            patient.                           - Resume previous diet.                           - Continue present medications.                           - Await pathology results.                           - Repeat colonoscopy is recommended. The  colonoscopy date will be determined after pathology                            results from today's exam become available for                            review. Jerene Bears, MD 05/22/2017 4:21:19 PM This report has been signed electronically.

## 2017-05-23 ENCOUNTER — Telehealth: Payer: Self-pay | Admitting: *Deleted

## 2017-05-23 ENCOUNTER — Telehealth: Payer: Self-pay

## 2017-05-23 NOTE — Telephone Encounter (Signed)
  Follow up Call-  Call back number 05/22/2017  Post procedure Call Back phone  # [phone]  Permission to leave phone message Yes  Some recent data might be hidden     No answer.  LMOM. Will try again this afternoon. Angela/Recovery Room

## 2017-05-23 NOTE — Telephone Encounter (Signed)
  Follow up Call-  Call back number 05/22/2017  Post procedure Call Back phone  # [phone]  Permission to leave phone message Yes  Some recent data might be hidden     Patient questions:  Do you have a fever, pain , or abdominal swelling? No. Pain Score  0 *  Have you tolerated food without any problems? Yes.    Have you been able to return to your normal activities? Yes.    Do you have any questions about your discharge instructions: Diet   No. Medications  No. Follow up visit  No.  Do you have questions or concerns about your Care? No.  Actions: * If pain score is 4 or above: No action needed, pain <4.

## 2017-05-28 ENCOUNTER — Encounter: Payer: Self-pay | Admitting: Internal Medicine

## 2017-05-30 ENCOUNTER — Encounter: Payer: Self-pay | Admitting: Internal Medicine

## 2017-10-12 ENCOUNTER — Other Ambulatory Visit: Payer: Self-pay | Admitting: Family Medicine

## 2017-10-12 ENCOUNTER — Ambulatory Visit
Admission: RE | Admit: 2017-10-12 | Discharge: 2017-10-12 | Disposition: A | Discharge status: Home / Self Care | Payer: BLUE CROSS/BLUE SHIELD | Source: Ambulatory Visit | Attending: Family Medicine | Admitting: Family Medicine

## 2017-10-12 DIAGNOSIS — I10 Essential (primary) hypertension: Secondary | ICD-10-CM | POA: Diagnosis not present

## 2017-10-12 DIAGNOSIS — E7800 Pure hypercholesterolemia, unspecified: Secondary | ICD-10-CM | POA: Diagnosis not present

## 2017-10-12 DIAGNOSIS — R079 Chest pain, unspecified: Secondary | ICD-10-CM

## 2017-10-12 DIAGNOSIS — I5189 Other ill-defined heart diseases: Secondary | ICD-10-CM | POA: Diagnosis not present

## 2017-11-14 DIAGNOSIS — I10 Essential (primary) hypertension: Secondary | ICD-10-CM | POA: Diagnosis not present

## 2017-11-14 DIAGNOSIS — R079 Chest pain, unspecified: Secondary | ICD-10-CM | POA: Diagnosis not present

## 2017-11-30 DIAGNOSIS — E7800 Pure hypercholesterolemia, unspecified: Secondary | ICD-10-CM | POA: Diagnosis not present

## 2018-01-01 DIAGNOSIS — Z6830 Body mass index (BMI) 30.0-30.9, adult: Secondary | ICD-10-CM | POA: Diagnosis not present

## 2018-01-01 DIAGNOSIS — Z1231 Encounter for screening mammogram for malignant neoplasm of breast: Secondary | ICD-10-CM | POA: Diagnosis not present

## 2018-01-01 DIAGNOSIS — Z01419 Encounter for gynecological examination (general) (routine) without abnormal findings: Secondary | ICD-10-CM | POA: Diagnosis not present

## 2018-03-15 DIAGNOSIS — Z0000 Encounter for general adult medical examination without abnormal findings: Secondary | ICD-10-CM | POA: Diagnosis not present

## 2018-03-15 DIAGNOSIS — E7800 Pure hypercholesterolemia, unspecified: Secondary | ICD-10-CM | POA: Diagnosis not present

## 2018-04-05 DIAGNOSIS — R945 Abnormal results of liver function studies: Secondary | ICD-10-CM | POA: Diagnosis not present

## 2018-09-17 DIAGNOSIS — E7800 Pure hypercholesterolemia, unspecified: Secondary | ICD-10-CM | POA: Diagnosis not present

## 2018-09-17 DIAGNOSIS — E663 Overweight: Secondary | ICD-10-CM | POA: Diagnosis not present

## 2018-09-17 DIAGNOSIS — I10 Essential (primary) hypertension: Secondary | ICD-10-CM | POA: Diagnosis not present

## 2019-01-16 DIAGNOSIS — Z6830 Body mass index (BMI) 30.0-30.9, adult: Secondary | ICD-10-CM | POA: Diagnosis not present

## 2019-01-16 DIAGNOSIS — Z1231 Encounter for screening mammogram for malignant neoplasm of breast: Secondary | ICD-10-CM | POA: Diagnosis not present

## 2019-01-16 DIAGNOSIS — Z01419 Encounter for gynecological examination (general) (routine) without abnormal findings: Secondary | ICD-10-CM | POA: Diagnosis not present

## 2019-04-29 ENCOUNTER — Other Ambulatory Visit: Payer: Self-pay

## 2019-04-29 ENCOUNTER — Encounter: Payer: Self-pay | Admitting: Emergency Medicine

## 2019-04-29 ENCOUNTER — Ambulatory Visit
Admission: EM | Admit: 2019-04-29 | Discharge: 2019-04-29 | Disposition: A | Discharge status: Home / Self Care | Payer: BC Managed Care – PPO | Attending: Emergency Medicine | Admitting: Emergency Medicine

## 2019-04-29 DIAGNOSIS — N3001 Acute cystitis with hematuria: Secondary | ICD-10-CM

## 2019-04-29 DIAGNOSIS — B9689 Other specified bacterial agents as the cause of diseases classified elsewhere: Secondary | ICD-10-CM | POA: Diagnosis not present

## 2019-04-29 LAB — POCT URINALYSIS DIP (MANUAL ENTRY)
Bilirubin, UA: NEGATIVE
Glucose, UA: NEGATIVE mg/dL
Ketones, POC UA: NEGATIVE mg/dL
Nitrite, UA: POSITIVE — AB
Protein Ur, POC: NEGATIVE mg/dL
Spec Grav, UA: 1.015 (ref 1.010–1.025)
Urobilinogen, UA: 0.2 E.U./dL
pH, UA: 7 (ref 5.0–8.0)

## 2019-04-29 MED ORDER — CEPHALEXIN 500 MG PO CAPS: 500.0000 mg | ORAL_CAPSULE | Freq: Two times a day (BID) | ORAL | 0 refills | Status: AC

## 2019-04-29 MED ORDER — FLUCONAZOLE 200 MG PO TABS: 200.0000 mg | ORAL_TABLET | Freq: Once | ORAL | 0 refills | Status: AC

## 2019-04-29 NOTE — Discharge Instructions (Signed)
Take antibiotic as prescribed: Important finish course. May take Diflucan with last dose of antibiotic to help prevent yeast infection. Important to follow-up with your primary care provider as you may need further evaluation/management for persistent, worsening symptoms. Urine culture pending: We will call you if we need to change antibiotics.

## 2019-04-29 NOTE — ED Notes (Signed)
Patient able to ambulate independently  

## 2019-04-29 NOTE — ED Provider Notes (Signed)
[facility]    CSN: [id number] Arrival date & time: 04/29/19  1601      History   Chief Complaint Chief Complaint  Patient presents with  . Dysuria    HPI Dawn Daniels is a 53 y.o. female with history of hypertension presenting for 24-hour course of burning with urination, urinary frequency.  Patient states that she was evaluated/treated for this via virtual visit by external provider 12/4: Given Macrobid which she finished 4 days ago.  Patient states that she had complete resolution of symptoms, though they returned "with a vengeance "today.  Patient denies abdominal pain, pelvic pain, vaginal discharge.  Patient is status post hysterectomy, not getting cycles.  Patient taking Azo for burning with adequate relief: Last dose around 2 PM today.  Patient denies history of pyelonephritis, renal calculi.  Past Medical History:  Diagnosis Date  . Diastolic dysfunction without heart failure   . Essential hypertension   . Hyperlipidemia   . Menometrorrhagia    a. s/p hysterectomy.  . Obesity     Patient Active Problem List   Diagnosis Date Noted  . Essential hypertension 02/26/2017  . Diastolic dysfunction without heart failure   . Chest discomfort 02/07/2017    Past Surgical History:  Procedure Laterality Date  . ABDOMINAL HYSTERECTOMY    . CESAREAN SECTION  1999, 2000  . TUBAL LIGATION  1992   for ectopic pregnancy   . WISDOM TOOTH EXTRACTION      OB History   No obstetric history on file.      Home Medications    Prior to Admission medications   Medication Sig Start Date End Date Taking? Authorizing Provider  valsartan (DIOVAN) 160 MG tablet Take 160 mg by mouth daily.   Yes [provider]  amLODipine (NORVASC) 5 MG tablet Take 1 tablet (5 mg total) by mouth daily. 02/15/17 05/22/17  Dunn, Nedra Hai, PA-C  cephALEXin (KEFLEX) 500 MG capsule Take 1 capsule (500 mg total) by mouth 2 (two) times daily for 5 days. 04/29/19 05/04/19   Hall-Potvin, [place], PA-C  fluconazole (DIFLUCAN) 200 MG tablet Take 1 tablet (200 mg total) by mouth once for 1 dose. May repeat in 72 hours if needed 04/29/19 04/29/19  Hall-Potvin, [place], PA-C    Family History Family History  Problem Relation Age of Onset  . Hypertension Mother   . Other Father        Father's history not known  . CAD Neg Hx   . Colon cancer Neg Hx   . Colon polyps Neg Hx   . Esophageal cancer Neg Hx   . Rectal cancer Neg Hx   . Stomach cancer Neg Hx     Social History Social History   Tobacco Use  . Smoking status: Former Research scientist (life sciences)  . Smokeless tobacco: Never Used  . Tobacco comment: Smoked for 2 years as a teenager  Substance Use Topics  . Alcohol use: No  . Drug use: No     Allergies   Patient has no known allergies.   Review of Systems Review of Systems  Constitutional: Negative for fatigue and fever.  Respiratory: Negative for cough and shortness of breath.   Cardiovascular: Negative for chest pain and palpitations.  Gastrointestinal: Negative for constipation and diarrhea.  Genitourinary: Positive for dysuria, frequency and urgency. Negative for flank pain, hematuria, pelvic pain, vaginal bleeding, vaginal discharge and vaginal pain.     Physical Exam Triage Vital Signs ED Triage Vitals  Enc Vitals Group  BP 04/29/19 1624 (!) 171/97     Pulse Rate 04/29/19 1624 71     Resp 04/29/19 1624 18     Temp 04/29/19 1624 97.8 F (36.6 C)     Temp Source 04/29/19 1624 Temporal     SpO2 04/29/19 1624 97 %     Weight --      Height --      Head Circumference --      Peak Flow --      Pain Score 04/29/19 1626 6     Pain Loc --      Pain Edu? --      Excl. in [place]? --    No data found.  Updated Vital Signs BP (!) 154/93 (BP Location: Left Arm)   Pulse 71   Temp 97.8 F (36.6 C) (Temporal)   Resp 18   SpO2 97%   Visual Acuity Right Eye Distance:   Left Eye Distance:   Bilateral Distance:    Right Eye Near:   Left Eye  Near:    Bilateral Near:     Physical Exam Constitutional:      General: She is not in acute distress. HENT:     Head: Normocephalic and atraumatic.  Eyes:     General: No scleral icterus.    Pupils: Pupils are equal, round, and reactive to light.  Cardiovascular:     Rate and Rhythm: Normal rate.  Pulmonary:     Effort: Pulmonary effort is normal.  Abdominal:     General: Bowel sounds are normal.     Palpations: Abdomen is soft.     Tenderness: There is no abdominal tenderness. There is no right CVA tenderness, left CVA tenderness or guarding.  Skin:    Coloration: Skin is not jaundiced or pale.  Neurological:     Mental Status: She is alert and oriented to person, place, and time.      UC Treatments / Results  Labs (all labs ordered are listed, but only abnormal results are displayed) Labs Reviewed  POCT URINALYSIS DIP (MANUAL ENTRY) - Abnormal; Notable for the following components:      Result Value   Blood, UA small (*)    Nitrite, UA Positive (*)    Leukocytes, UA Small (1+) (*)    All other components within normal limits  URINE CULTURE    EKG   Radiology No results found.  Procedures Procedures (including critical care time)  Medications Ordered in UC Medications - No data to display  Initial Impression / Assessment and Plan / UC Course  I have reviewed the triage vital signs and the nursing notes.  Pertinent labs & imaging results that were available during my care of the patient were reviewed by me and considered in my medical decision making (see chart for details).     POCT urinalysis dipstick done in office, reviewed by me: Showing small blood, positive nitrates, small leukocytes-culture pending.  Will start Keflex today.  Patient endorsing history of yeast infections status post antibiotic use: Diflucan sent.  Return precautions discussed, patient verbalized understanding and is agreeable to plan. Final Clinical Impressions(s) / UC Diagnoses    Final diagnoses:  Acute cystitis with hematuria     Discharge Instructions     Take antibiotic as prescribed: Important finish course. May take Diflucan with last dose of antibiotic to help prevent yeast infection. Important to follow-up with your primary care provider as you may need further evaluation/management for persistent, worsening symptoms. Urine culture  pending: We will call you if we need to change antibiotics.    ED Prescriptions    Medication Sig Dispense Auth. Provider   cephALEXin (KEFLEX) 500 MG capsule Take 1 capsule (500 mg total) by mouth 2 (two) times daily for 5 days. 10 capsule Hall-Potvin, [place], PA-C   fluconazole (DIFLUCAN) 200 MG tablet Take 1 tablet (200 mg total) by mouth once for 1 dose. May repeat in 72 hours if needed 2 tablet Hall-Potvin, [place], PA-C     PDMP not reviewed this encounter.   Hall-Potvin, [place], [place] 04/29/19 1725

## 2019-04-29 NOTE — ED Triage Notes (Addendum)
Pt presents to [facility] for assessment of burning with urination and urinary frequency.  Treated on 12/4 for the same through televisit, but symptoms returned today "with a vengeance".  Pt denies abdominal pain.  C/o chills.  Denies n/v.  Pt is currently taking AZO

## 2019-05-02 LAB — URINE CULTURE: Culture: 30000 — AB

## 2019-06-11 ENCOUNTER — Telehealth: Payer: Self-pay | Admitting: Radiology

## 2019-06-11 ENCOUNTER — Encounter: Payer: Self-pay | Admitting: Internal Medicine

## 2019-06-11 ENCOUNTER — Telehealth (INDEPENDENT_AMBULATORY_CARE_PROVIDER_SITE_OTHER): Payer: 59 | Admitting: Internal Medicine

## 2019-06-11 VITALS — BP 151/94 | HR 58 | Ht 66.0 in | Wt 190.0 lb

## 2019-06-11 DIAGNOSIS — R079 Chest pain, unspecified: Secondary | ICD-10-CM

## 2019-06-11 DIAGNOSIS — R55 Syncope and collapse: Secondary | ICD-10-CM

## 2019-06-11 DIAGNOSIS — R002 Palpitations: Secondary | ICD-10-CM | POA: Diagnosis not present

## 2019-06-11 DIAGNOSIS — I10 Essential (primary) hypertension: Secondary | ICD-10-CM

## 2019-06-11 DIAGNOSIS — R072 Precordial pain: Secondary | ICD-10-CM | POA: Diagnosis not present

## 2019-06-11 NOTE — Progress Notes (Signed)
Virtual Visit via Video Note   This visit type was conducted due to national recommendations for restrictions regarding the COVID-19 Pandemic (e.g. social distancing) in an effort to limit this patient's exposure and mitigate transmission in our community.  Due to her co-morbid illnesses, this patient is at least at moderate risk for complications without adequate follow up.  This format is felt to be most appropriate for this patient at this time.  All issues noted in this document were discussed and addressed.  A limited physical exam was performed with this format.  Please refer to the patient's chart for her consent to telehealth for [facility]   Date:  06/11/2019   ID:  Daniels, Dawn 20-Aug-1965, MRN [id number]  Patient Location: Home Provider Location: Home  PCP:  Donald Prose, MD  Cardiologist:  No primary care provider on file.  Electrophysiologist:  None   Evaluation Performed:  New Patient Evaluation  Chief Complaint: Chest pain, palpitations  History of Present Illness:    Dawn Daniels is a 54 y.o. female with menometrorrhagia s/p hysterectomy, hyperlipidemia, brief tobacco use (2 years), obesity who presents for follow-up of chest discomfort.  She was seen in October 2018 by Melina Copa, PA-C.  She was endorsing similar symptoms at that time.  She described for Dayna a sense of left-sided chest discomfort that also feels as if fluid is moving up and down, as if it is rattling.  That sensation had occurred for over a year and a half.  The sensation worsens with activity and improves with rest. 2D Echo 02/07/17 showed mild focal basal hypertrophy of the septum, EF 60-65%, grade 1 DD. Subsequent stress echo was normal. She exercised for 10 minutes without any chest pain or dyspnea, reaching 11.6 METS in stage 4. She had a hypertensive response to exercise with a blood pressure at peak exercise of 202/99 mmHg.  She was recommended to take amlodipine 5 mg daily but  experienced ankle swelling and was transition to valsartan 160 mg daily by her primary care provider.  In 2017 she had hyperlipidemia with an LDL of 163, HDL of 52, triglycerides 158, total cholesterol 248  She is still having discomfort in her chest with exertion and notes that this primarily occurs when she goes hiking.  She feels as if her heart rate goes up "astronomically" and feels a substernal chest pumping.  She still gets a sensation as if a valve in her heart has opened up and will not close and also has a sense of ringing in her ears where she can barely hear and her heart rate going sky high.  She feels as if her vision has black flashes then it and she becomes disoriented and confused.  This description fits the episode last June, most episodes are not that severe but are on the spectrum of symptoms.  She will get similar but less severe symptoms when she climbs the stairs to her laundry room and walks outside at a fast pace.  It takes her 3 to 4 minutes of resting recovery for her symptoms to resolve.  She feels that during these episodes she turns blood red and feels a rush in her head.  She feels as if something is bubbling up in her chest.  Deep breathing does not improve or worsen the pain.  The discomfort in her chest will occur anytime she does anything strenuous.  During the episode last June she did experience presyncope but otherwise does not have  presyncopal symptoms.  She has had no lifetime syncope.  No family history of sudden cardiac death or unexplained syncope.  No early MI in the family.  The patient does not have symptoms concerning for COVID-19 infection (fever, chills, cough, or new shortness of breath).    Past Medical History:  Diagnosis Date  . Diastolic dysfunction without heart failure   . Essential hypertension   . Hyperlipidemia   . Menometrorrhagia    a. s/p hysterectomy.  . Obesity    Past Surgical History:  Procedure Laterality Date  . ABDOMINAL  HYSTERECTOMY    . CESAREAN SECTION  1999, 2000  . TUBAL LIGATION  1992   for ectopic pregnancy   . WISDOM TOOTH EXTRACTION       Current Meds  Medication Sig  . valsartan (DIOVAN) 160 MG tablet Take 160 mg by mouth daily.   Current Facility-Administered Medications for the 06/11/19 encounter (Telemedicine) with Elouise Munroe, MD  Medication  . 0.9 %  sodium chloride infusion     Allergies:   Patient has no known allergies.   Social History   Tobacco Use  . Smoking status: Former Research scientist (life sciences)  . Smokeless tobacco: Never Used  . Tobacco comment: Smoked for 2 years as a teenager  Substance Use Topics  . Alcohol use: No  . Drug use: No     Family Hx: The patient's family history includes Hypertension in her mother; Other in her father. There is no history of CAD, Colon cancer, Colon polyps, Esophageal cancer, Rectal cancer, or Stomach cancer.  ROS:   Please see the history of present illness.     All other systems reviewed and are negative.   Prior CV studies:   The following studies were reviewed today:  Stress echocardiogram 03/03/2017, echocardiogram September 2018  Labs/Other Tests and Data Reviewed:    EKG:  An ECG dated January 04, 2017 was personally reviewed today and demonstrated:  Normal sinus rhythm  Recent Labs: No results found for requested labs within last 8760 hours.   Recent Lipid Panel No results found for: CHOL, TRIG, HDL, CHOLHDL, LDLCALC, LDLDIRECT  Wt Readings from Last 3 Encounters:  06/11/19 190 lb (86.2 kg)  05/22/17 199 lb (90.3 kg)  04/11/17 199 lb (90.3 kg)     Objective:    Vital Signs:  BP (!) 151/94   Pulse (!) 58   Ht 5\' 6"  (1.676 m)   Wt 190 lb (86.2 kg)   BMI 30.67 kg/m    VITAL SIGNS:  reviewed GEN:  no acute distress EYES:  sclerae anicteric, EOMI - Extraocular Movements Intact RESPIRATORY:  normal respiratory effort, symmetric expansion CARDIOVASCULAR:  no peripheral edema SKIN:  no rash, lesions or  ulcers. MUSCULOSKELETAL:  no obvious deformities. NEURO:  alert and oriented x 3, no obvious focal deficit PSYCH:  normal affect  ASSESSMENT & PLAN:    1. Palpitations   2. Chest pain, unspecified type   3. Precordial pain   4. Near syncope   5. Essential hypertension    Atypical chest pain-I have independently reviewed the images from the echocardiogram performed February 07, 2017.  I am unable to view the images from the stress echocardiogram performed February 15, 2017 due to limitations of the [employer].  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  No systolic anterior motion of the mitral  valve is seen, no LVOT obstruction is seen.  My initial concern when speaking to the patient today was to exclude hypertrophic cardiomyopathy or left ventricular outflow tract obstruction.  Based on the images we have thus far, this does not appear to be the case.  Her chest discomfort is concerning given risk factors of hypertension and dyslipidemia.  I suspect that her discomfort in her chest is related to elevated blood pressure with exertion.  We cannot exclude the possibility of ischemia without further evaluation.  I would like to perform a CT coronary angiogram to evaluate the patient's coronary arteries as well as evaluate for any other structural causes of chest pain.  I would like to ensure that she does not have abnormalities of the aorta such as a coarctation that might contribute to hypertension.  Hypertension and exercise hypertension-her blood pressure lives around 130/80 per her report and she does not check her blood pressure at home.  She is currently only on medical therapy with valsartan 160 mg daily.  It is likely she will need further therapy to optimize her exercise blood pressures.  Her blood pressure is elevated  today.  I think we need to follow this more closely, and I would like to see the patient back in 2 weeks with her home blood pressure log to ensure that we have a good sense of her daily blood pressures.  We will likely need additional therapy.  Her pulse is low at baseline which may not allow [place] to add beta-blocker, she may be a good candidate for addition of a diuretic.  I have asked her to take her blood pressure at least 3 times a week and bring this to our next visit.  She plans to leave town next week and visit with family members who were not in her immediate household.  To limit the office staff exposure to potential COVID-19 infection, we will plan a virtual visit for next visit, and after period of social distancing and isolation, we will bring her to the office for further evaluation.  Palpitations-her symptoms with exertion sound consistent with palpitations.  It would be best to get a cardiac monitor to exclude malignant arrhythmia or atrial fibrillation.  We will obtain a 30-day monitor so as to ensure we can capture her symptoms.   COVID-19 Education: The signs and symptoms of COVID-19 were discussed with the patient and how to seek care for testing (follow up with PCP or arrange E-visit).  The importance of social distancing was discussed today.  Time:   Today, I have spent 60 minutes on this encounter reviewing past medical records, radiology and imaging studies, and previous evaluation.  Greater than 50% of that time was spent with the patient with telehealth technology discussing the above problems (36 minutes).     Medication Adjustments/Labs and Tests Ordered: Current medicines are reviewed at length with the patient today.  Concerns regarding medicines are outlined above.   Tests Ordered: Orders Placed This Encounter  Procedures  . CT CORONARY MORPH W/CTA COR W/SCORE W/CA W/CM &/OR WO/CM  . CT CORONARY FRACTIONAL FLOW RESERVE DATA PREP  . CT CORONARY FRACTIONAL FLOW RESERVE  FLUID ANALYSIS  . Basic metabolic panel  . CARDIAC EVENT MONITOR    Medication Changes: No orders of the defined types were placed in this encounter.   Follow Up:  Virtual Visit  in 2 week(s)  Signed, Elouise Munroe, MD  06/11/2019 1:13 PM    [place]

## 2019-06-11 NOTE — Telephone Encounter (Signed)
Enrolled patient for a 30 day [employer] Event monitor to be mailed to patients home.  

## 2019-06-11 NOTE — Patient Instructions (Addendum)
Medication Instructions:  The current medical regimen is effective;  continue present plan and medications.  *If you need a refill on your cardiac medications before your next appointment, please call your pharmacy*  Lab Work: BMET one week before CT (when scheduled) If you have labs (blood work) drawn today and your tests are completely normal, you will receive your results only by: Marland Kitchen MyChart Message (if you have MyChart) OR . A paper copy in the mail If you have any lab test that is abnormal or we need to change your treatment, we will call you to review the results.  Testing/Procedures: Your physician has recommended that you wear an event monitor. Event monitors are medical devices that record the heart's electrical activity. Doctors most often [place] these monitors to diagnose arrhythmias. Arrhythmias are problems with the speed or rhythm of the heartbeat. The monitor is a small, portable device. You can wear one while you do your normal daily activities. This is usually used to diagnose what is causing palpitations/syncope (passing out).  Your physician has requested that you have cardiac CT. Cardiac computed tomography (CT) is a painless test that uses an x-ray machine to take clear, detailed pictures of your heart. For further information please visit [employer]uge[employer]iest[employer]tn. Please follow instruction sheet as given.  Follow-Up: At [facility], you and your health needs are our priority.  As part of our continuing mission to provide you with exceptional heart care, we have created designated Provider Care Teams.  These Care Teams include your primary Cardiologist (physician) and Advanced Practice Providers (APPs -  Physician Assistants and Nurse Practitioners) who all work together to provide you with the care you need, when you need it.  Your next appointment:   2 week(s)  The format for your next appointment:   Virtual Visit   Provider:   Cherlynn Kaiser, MD   Please monitor your  blood pressure at home- call with any questions.    Your cardiac CT will be scheduled at one of the below locations:   [facility] [address] [place],  [id number] [phone]  If scheduled at [facility], please arrive at the [facility] main entrance of [facility] 30-45 minutes prior to test start time. Proceed to the [facility] Radiology Department (first floor) to check-in and test prep.  Please follow these instructions carefully (unless otherwise directed):  Hold all erectile dysfunction medications at least 3 days (72 hrs) prior to test.  On the Night Before the Test: . Be sure to Drink plenty of water. . Do not consume any caffeinated/decaffeinated beverages or chocolate 12 hours prior to your test. . Do not take any antihistamines 12 hours prior to your test.  On the Day of the Test: . Drink plenty of water. Do not drink any water within one hour of the test. . Do not eat any food 4 hours prior to the test. . You may take your regular medications prior to the test.  . HOLD Furosemide/Hydrochlorothiazide morning of the test. . FEMALES- please wear underwire-free bra if available       After the Test: . Drink plenty of water. . After receiving IV contrast, you may experience a mild flushed feeling. This is normal. . On occasion, you may experience a mild rash up to 24 hours after the test. This is not dangerous. If this occurs, you can take Benadryl 25 mg and increase your fluid intake. . If you experience trouble breathing, this can be  serious. If it is severe call 911 IMMEDIATELY. If it is mild, please call our office. . If you take any of these medications: Glipizide/Metformin, Avandament, Glucavance, please do not take 48 hours after completing test unless otherwise instructed.   Once we have confirmed authorization from your insurance company, we will call you to set up a date and time for your test.   For non-scheduling  related questions, please contact the cardiac imaging nurse navigator should you have any questions/concerns: Marchia Bond, RN Navigator Cardiac Imaging [facility] [phone] Office

## 2019-06-19 ENCOUNTER — Encounter (INDEPENDENT_AMBULATORY_CARE_PROVIDER_SITE_OTHER): Payer: 59

## 2019-06-19 DIAGNOSIS — R002 Palpitations: Secondary | ICD-10-CM

## 2019-06-19 DIAGNOSIS — R072 Precordial pain: Secondary | ICD-10-CM

## 2019-06-25 ENCOUNTER — Telehealth (INDEPENDENT_AMBULATORY_CARE_PROVIDER_SITE_OTHER): Payer: 59 | Admitting: Internal Medicine

## 2019-06-25 ENCOUNTER — Encounter: Payer: Self-pay | Admitting: *Deleted

## 2019-06-25 VITALS — BP 162/99 | HR 59 | Ht 67.0 in | Wt 190.0 lb

## 2019-06-25 DIAGNOSIS — R079 Chest pain, unspecified: Secondary | ICD-10-CM

## 2019-06-25 DIAGNOSIS — R002 Palpitations: Secondary | ICD-10-CM

## 2019-06-25 DIAGNOSIS — R55 Syncope and collapse: Secondary | ICD-10-CM

## 2019-06-25 DIAGNOSIS — I10 Essential (primary) hypertension: Secondary | ICD-10-CM | POA: Diagnosis not present

## 2019-06-25 MED ORDER — SPIRONOLACTONE 25 MG PO TABS: 25.0000 mg | ORAL_TABLET | Freq: Every day | ORAL | 3 refills | Status: DC

## 2019-06-25 NOTE — Progress Notes (Signed)
Virtual Visit via Video Note   This visit type was conducted due to national recommendations for restrictions regarding the COVID-19 Pandemic (e.g. social distancing) in an effort to limit this patient's exposure and mitigate transmission in our community.  Due to her co-morbid illnesses, this patient is at least at moderate risk for complications without adequate follow up.  This format is felt to be most appropriate for this patient at this time.  All issues noted in this document were discussed and addressed.  A limited physical exam was performed with this format.  Please refer to the patient's chart for her consent to telehealth for [facility].   Date:  06/25/2019   ID:  Ryanne, Mihara [birth year], MRN [id number]  Patient Location: Home Provider Location: Home  PCP:  Donald Prose, MD  Cardiologist:  No primary care provider on file.  Electrophysiologist:  None   Evaluation Performed:  Follow-Up Visit  Chief Complaint:  F/u chest pain and palpitations  History of Present Illness:    Dawn Daniels is a 54 y.o. female with menometrorrhagia s/p hysterectomy, hyperlipidemia, brief tobacco use (2 years), obesity who presents for follow-up of chest discomfort.  Did a hike while away with family, did much better, but had a migraine. Same as episode in June. She describes her symptoms now as quite consistent with headache/migraine. Chest pain was not severe. Some palpitations.  Bps at home: Systolic 0000000. Diastolic usually in 0000000.  We discussed medical therapy for HTN. Cannot take amlodipine due to swelling of legs.   The patient does not have symptoms concerning for COVID-19 infection (fever, chills, cough, or new shortness of breath).    Past Medical History:  Diagnosis Date  . Diastolic dysfunction without heart failure   . Essential hypertension   . Hyperlipidemia   . Menometrorrhagia    a. s/p hysterectomy.  . Obesity    Past Surgical History:    Procedure Laterality Date  . ABDOMINAL HYSTERECTOMY    . CESAREAN SECTION  1999, 2000  . TUBAL LIGATION  1992   for ectopic pregnancy   . WISDOM TOOTH EXTRACTION       Current Meds  Medication Sig  . valsartan (DIOVAN) 160 MG tablet Take 160 mg by mouth daily.   Current Facility-Administered Medications for the 06/25/19 encounter (Telemedicine) with Elouise Munroe, MD  Medication  . 0.9 %  sodium chloride infusion     Allergies:   Patient has no known allergies.   Social History   Tobacco Use  . Smoking status: Former Research scientist (life sciences)  . Smokeless tobacco: Never Used  . Tobacco comment: Smoked for 2 years as a teenager  Substance Use Topics  . Alcohol use: No  . Drug use: No     Family Hx: The patient's family history includes Hypertension in her mother; Other in her father. There is no history of CAD, Colon cancer, Colon polyps, Esophageal cancer, Rectal cancer, or Stomach cancer.  ROS:   Please see the history of present illness.     All other systems reviewed and are negative.   Prior CV studies:   The following studies were reviewed today:    Labs/Other Tests and Data Reviewed:    EKG:  No ECG reviewed.  Recent Labs: No results found for requested labs within last 8760 hours.   Recent Lipid Panel No results found for: CHOL, TRIG, HDL, CHOLHDL, LDLCALC, LDLDIRECT  Wt Readings from Last 3 Encounters:  06/25/19 190 lb (86.2 kg)  06/11/19 190 lb (86.2 kg)  05/22/17 199 lb (90.3 kg)     Objective:    Vital Signs:  BP (!) 162/99   Pulse (!) 59   Ht 5\' 7"  (1.702 m)   Wt 190 lb (86.2 kg)   BMI 29.76 kg/m    VITAL SIGNS:  reviewed GEN:  no acute distress EYES:  sclerae anicteric, EOMI - Extraocular Movements Intact RESPIRATORY:  normal respiratory effort, symmetric expansion CARDIOVASCULAR:  no peripheral edema SKIN:  no rash, lesions or ulcers. MUSCULOSKELETAL:  no obvious deformities. NEURO:  alert and oriented x 3, no obvious focal deficit PSYCH:   normal affect  ASSESSMENT & PLAN:    1. Essential hypertension   2. Chest pain, unspecified type   3. Palpitations   4. Near syncope    HTN - her symptoms may be coming from significantly elevated blood pressures with activity. In addition to her valsartan, we will initiate spironolactone. She will report if her symptoms with activity improve with better BP control.   Chest pain - we should continue the ischemic evaluation and I recommend CCTA to exclude coronary anomalies or obstructive CAD, in addition, we can evaluate for coarctation of the aorta with her history of HTN and migraines.   Palpitations - monitor pending.   COVID-19 Education: The signs and symptoms of COVID-19 were discussed with the patient and how to seek care for testing (follow up with PCP or arrange E-visit).  The importance of social distancing was discussed today.  Time:   Today, I have spent 25 minutes with the patient with telehealth technology discussing the above problems.     Medication Adjustments/Labs and Tests Ordered: Current medicines are reviewed at length with the patient today.  Concerns regarding medicines are outlined above.   Tests Ordered: Orders Placed This Encounter  Procedures  . Basic metabolic panel    Medication Changes: Meds ordered this encounter  Medications  . spironolactone (ALDACTONE) 25 MG tablet    Sig: Take 1 tablet (25 mg total) by mouth daily.    Dispense:  90 tablet    Refill:  3    Follow Up:  1 mo  Signed, Elouise Munroe, MD  06/25/2019 10:00 AM    [place]

## 2019-07-21 ENCOUNTER — Other Ambulatory Visit (HOSPITAL_COMMUNITY): Payer: Self-pay | Admitting: Emergency Medicine

## 2019-07-21 ENCOUNTER — Telehealth (HOSPITAL_COMMUNITY): Payer: Self-pay | Admitting: Emergency Medicine

## 2019-07-21 ENCOUNTER — Encounter (HOSPITAL_COMMUNITY): Payer: Self-pay

## 2019-07-21 DIAGNOSIS — R079 Chest pain, unspecified: Secondary | ICD-10-CM

## 2019-07-21 NOTE — Telephone Encounter (Signed)
Left message on voicemail with name and callback number Diamante Truszkowski RN Navigator Cardiac Imaging [facility] [phone] Office [phone] Cell  

## 2019-07-22 ENCOUNTER — Ambulatory Visit (HOSPITAL_COMMUNITY)
Admission: RE | Admit: 2019-07-22 | Discharge: 2019-07-22 | Disposition: A | Discharge status: Home / Self Care | Payer: No Typology Code available for payment source | Source: Ambulatory Visit | Attending: Internal Medicine | Admitting: Internal Medicine

## 2019-07-22 ENCOUNTER — Other Ambulatory Visit: Payer: Self-pay

## 2019-07-22 DIAGNOSIS — Z006 Encounter for examination for normal comparison and control in clinical research program: Secondary | ICD-10-CM

## 2019-07-22 DIAGNOSIS — R079 Chest pain, unspecified: Secondary | ICD-10-CM

## 2019-07-22 MED ORDER — NITROGLYCERIN 0.4 MG SL SUBL: 0.8000 mg | SUBLINGUAL_TABLET | Freq: Once | SUBLINGUAL | Status: AC

## 2019-07-22 MED ORDER — IOHEXOL 350 MG/ML SOLN
100.0000 mL | Freq: Once | INTRAVENOUS | Status: AC | PRN
  Administered 2019-07-22: 16:00:00 100 mL via INTRAVENOUS

## 2019-07-22 MED ORDER — NITROGLYCERIN 0.4 MG SL SUBL
SUBLINGUAL_TABLET | SUBLINGUAL | Status: AC
  Administered 2019-07-22: 0.8 mg via SUBLINGUAL
  Filled 2019-07-22: qty 2

## 2019-07-22 NOTE — Research (Signed)
Cadfem Informed Consent    Patient Name: Dawn Daniels   Subject met inclusion and exclusion criteria.  The informed consent form, study requirements and expectations were reviewed with the subject and questions and concerns were addressed prior to the signing of the consent form.  The subject verbalized understanding of the trail requirements.  The subject agreed to participate in the CADFEM trial and signed the informed consent.  The informed consent was obtained prior to performance of any protocol-specific procedures for the subject.  A copy of the signed informed consent was given to the subject and a copy was placed in the subject's medical record.   Neva Seat

## 2019-07-25 ENCOUNTER — Ambulatory Visit: Payer: 59 | Attending: Internal Medicine

## 2019-07-25 DIAGNOSIS — Z23 Encounter for immunization: Secondary | ICD-10-CM

## 2019-07-25 NOTE — Progress Notes (Signed)
   Covid-19 Vaccination Clinic  Name:  Dawn Daniels    MRN: [id number] DOB: 21-Nov-1965  07/25/2019  [name] was observed post Covid-19 immunization for 15 minutes without incident. She was provided with Vaccine Information Sheet and instruction to access the V-Safe system.   [name] was instructed to call 911 with any severe reactions post vaccine: Marland Kitchen Difficulty breathing  . Swelling of face and throat  . A fast heartbeat  . A bad rash all over body  . Dizziness and weakness   Immunizations Administered    Name Date Dose VIS Date Route   Pfizer COVID-19 Vaccine 07/25/2019  8:32 AM 0.3 mL 04/25/2019 Intramuscular   Manufacturer: [place]   Lot: [id number]   [place]: [id number]

## 2019-07-28 ENCOUNTER — Encounter: Payer: Self-pay | Admitting: Internal Medicine

## 2019-07-28 ENCOUNTER — Ambulatory Visit: Payer: 59 | Admitting: Internal Medicine

## 2019-07-28 ENCOUNTER — Other Ambulatory Visit: Payer: Self-pay

## 2019-07-28 VITALS — BP 119/69 | HR 69 | Temp 97.4°F | Ht 66.0 in | Wt 201.8 lb

## 2019-07-28 DIAGNOSIS — R079 Chest pain, unspecified: Secondary | ICD-10-CM | POA: Diagnosis not present

## 2019-07-28 DIAGNOSIS — R002 Palpitations: Secondary | ICD-10-CM

## 2019-07-28 DIAGNOSIS — R55 Syncope and collapse: Secondary | ICD-10-CM

## 2019-07-28 DIAGNOSIS — R911 Solitary pulmonary nodule: Secondary | ICD-10-CM

## 2019-07-28 DIAGNOSIS — I10 Essential (primary) hypertension: Secondary | ICD-10-CM | POA: Diagnosis not present

## 2019-07-28 MED ORDER — METOPROLOL TARTRATE 25 MG PO TABS: 25.0000 mg | ORAL_TABLET | Freq: Two times a day (BID) | ORAL | 3 refills | Status: DC

## 2019-07-28 NOTE — Patient Instructions (Addendum)
Medication Instructions:  Metoprolol tartrate 25 mg  One tablet twice a day    *If you need a refill on your cardiac medications before your next appointment, please call your pharmacy*   Lab Work: Not needed now    Testing/Procedures: Will be schedule in March 2022  In one year f/u lung nodule  Your physician has requested that you have cardiac CT. Cardiac computed tomography (CT) is a painless test that uses an x-ray machine to take clear, detailed pictures of your heart.    Follow-Up: At [facility], you and your health needs are our priority.  As part of our continuing mission to provide you with exceptional heart care, we have created designated Provider Care Teams.  These Care Teams include your primary Cardiologist (physician) and Advanced Practice Providers (APPs -  Physician Assistants and Nurse Practitioners) who all work together to provide you with the care you need, when you need it.  Your next appointment:   3 month(s)  The format for your next appointment:   In Person  Provider:   Cherlynn Kaiser, MD   Other Instructions

## 2019-07-28 NOTE — Progress Notes (Signed)
Cardiology Office Note:    Date:  07/28/2019   ID:  Dawn Daniels [birth year], MRN [id number]  PCP:  Donald Prose, MD  Cardiologist:  No primary care provider on file.  Electrophysiologist:  None   Referring MD: Donald Prose, MD   Chief Complaint: f/u chest pain, palpitations  History of Present Illness:    Dawn Daniels is a 54 y.o. female with a history of menometrorrhagia s/p hysterectomy, hyperlipidemia, brief tobacco use (2 years), obesity who presents for follow-up of chest discomfort and palpitations.  We reviewed the results of the CT coronary angiogram which showed no coronary calcium and minimal CAD in the RCA.  CT angio chest showed no evidence of aortic coarctation, which was obtained due to resting and exercise hypertension.  Cardiac monitor results reviewed as well, demonstrating sinus tachycardia and PACs correlating with symptoms of fluttering or skipped beats.  She also has some baseline sinus bradycardia.  She feels the monitor results are representative of her typical episodes while exerting herself.  We discussed again that symptoms are likely secondary to infrequent ectopy as well as blood pressure that elevates with activity, and treatment of hypertension may likely benefit symptoms.  She reminds me that she had COVID-19 infection in October.  We reviewed that she has a remote smoking history, and has multiple tiny pulmonary nodules in the lungs bilaterally and these are likely benign, however a repeat CT chest can be considered in 1 year to follow-up as per radiology guidelines.  At our last visit we initiated spironolactone which she is tolerating well.  Past Medical History:  Diagnosis Date  . Diastolic dysfunction without heart failure   . Essential hypertension   . Hyperlipidemia   . Menometrorrhagia    a. s/p hysterectomy.  . Obesity     Past Surgical History:  Procedure Laterality Date  . ABDOMINAL HYSTERECTOMY    . CESAREAN SECTION   1999, 2000  . TUBAL LIGATION  1992   for ectopic pregnancy   . WISDOM TOOTH EXTRACTION      Current Medications: Current Meds  Medication Sig  . spironolactone (ALDACTONE) 25 MG tablet Take 1 tablet (25 mg total) by mouth daily.  . valsartan (DIOVAN) 160 MG tablet Take 160 mg by mouth daily.   Current Facility-Administered Medications for the 07/28/19 encounter (Office Visit) with Elouise Munroe, MD  Medication  . 0.9 %  sodium chloride infusion     Allergies:   Patient has no known allergies.   Social History   Socioeconomic History  . Marital status: Married    Spouse name: Not on file  . Number of children: 2  . Years of education: 55  . Highest education level: Not on file  Occupational History  . Occupation: [employer]   Tobacco Use  . Smoking status: Former Research scientist (life sciences)  . Smokeless tobacco: Never Used  . Tobacco comment: Smoked for 2 years as a teenager  Substance and Sexual Activity  . Alcohol use: No  . Drug use: No  . Sexual activity: Not on file  Other Topics Concern  . Not on file  Social History Narrative  . Not on file   Social Determinants of Health   Financial Resource Strain:   . Difficulty of Paying Living Expenses:   Food Insecurity:   . Worried About Charity fundraiser in the Last Year:   . [place] in the Last Year:   Transportation Needs:   . Lack of  Transportation (Medical):   Marland Kitchen Lack of Transportation (Non-Medical):   Physical Activity:   . Days of Exercise per Week:   . Minutes of Exercise per Session:   Stress:   . Feeling of Stress :   Social Connections:   . Frequency of Communication with Friends and Family:   . Frequency of Social Gatherings with Friends and Family:   . Attends Religious Services:   . Active Member of Clubs or Organizations:   . Attends Archivist Meetings:   Marland Kitchen Marital Status:      Family History: The patient's family history includes Hypertension in her mother; Other in her father.  There is no history of CAD, Colon cancer, Colon polyps, Esophageal cancer, Rectal cancer, or Stomach cancer.  ROS:   Please see the history of present illness.    All other systems reviewed and are negative.  EKGs/Labs/Other Studies Reviewed:    The following studies were reviewed today:  EKG: ECG was not performed because of recent cardiac performed.  ECG will be obtained at her next visit.  I have independently reviewed the images from CTA chest 07/22/2019.  No evidence of coarctation of the aorta.  Recent Labs: No results found for requested labs within last 8760 hours.  Recent Lipid Panel No results found for: CHOL, TRIG, HDL, CHOLHDL, VLDL, LDLCALC, LDLDIRECT  Physical Exam:    VS:  BP 119/69   Pulse 69   Temp (!) 97.4 F (36.3 C)   Ht 5\' 6"  (1.676 m)   Wt 201 lb 12.8 oz (91.5 kg)   SpO2 97%   BMI 32.57 kg/m     Wt Readings from Last 5 Encounters:  06/25/19 190 lb (86.2 kg)  06/11/19 190 lb (86.2 kg)  05/22/17 199 lb (90.3 kg)  04/11/17 199 lb (90.3 kg)  02/27/17 199 lb 12.8 oz (90.6 kg)     Constitutional: No acute distress Eyes: sclera non-icteric, normal conjunctiva and lids ENMT: normal dentition, moist mucous membranes Cardiovascular: regular rhythm, normal rate, no murmurs. S1 and S2 normal. Radial pulses normal bilaterally. No jugular venous distention.  Respiratory: clear to auscultation bilaterally GI : normal bowel sounds, soft and nontender. No distention.   MSK: extremities warm, well perfused. No edema.  NEURO: grossly nonfocal exam, moves all extremities. PSYCH: alert and oriented x 3, normal mood and affect.   ASSESSMENT:    1. Chest pain, unspecified type   2. Solitary pulmonary nodule   3. Essential hypertension   4. Palpitations   5. Near syncope    PLAN:    Chest pain-has not had a significant episode since our last visit.  We discussed that this may be related to exercise hypertension and we should continue to treat with  spironolactone.  Blood pressure is excellent today.  Palpitations-related to sinus tachycardia and PACs.  I have offered the patient metoprolol tartrate 25 mg twice daily.  We discussed that her is already on the slightly lower side and she may not tolerate this and I described red flag symptoms.  However she would like to try it for improvement in her sensation of palpitations which are bothersome to her throughout the day.  We will prescribe metoprolol tartrate 25 mg ID.  Hypertension-spironolactone as above, blood pressure well controlled.  Multiple tiny pulmonary nodules-with remote smoking history, reasonable to consider repeat CT scan months.  Total time of encounter: 35 minutes total time of encounter, including 25 minutes spent in face-to-face patient care. This time includes coordination of  care and counseling regarding above mentioned problem list. Remainder of non-face-to-face time involved reviewing chart documents/testing relevant to the patient encounter and documentation in the medical record. I have independently reviewed documentation from referring provider.   ADDENDUM: experiencing fatigue with metoprolol due to bradycardia. 1/2 dose of metoprolol and follow up in June.  Cherlynn Kaiser, MD [place]  [facility]    Medication Adjustments/Labs and Tests Ordered: Current medicines are reviewed at length with the patient today.  Concerns regarding medicines are outlined above.  Orders Placed This Encounter  Procedures  . CT CHEST WO CONTRAST   Meds ordered this encounter  Medications  . metoprolol tartrate (LOPRESSOR) 25 MG tablet    Sig: Take 1 tablet (25 mg total) by mouth 2 (two) times daily.    Dispense:  180 tablet    Refill:  3    Patient Instructions  Medication Instructions:  Metoprolol tartrate 25 mg  One tablet twice a day    *If you need a refill on your cardiac medications before your next appointment, please call your pharmacy*   Lab  Work: Not needed now    Testing/Procedures: Will be schedule in March 2022  In one year f/u lung nodule  Your physician has requested that you have cardiac CT. Cardiac computed tomography (CT) is a painless test that uses an x-ray machine to take clear, detailed pictures of your heart.    Follow-Up: At [facility], you and your health needs are our priority.  As part of our continuing mission to provide you with exceptional heart care, we have created designated Provider Care Teams.  These Care Teams include your primary Cardiologist (physician) and Advanced Practice Providers (APPs -  Physician Assistants and Nurse Practitioners) who all work together to provide you with the care you need, when you need it.  Your next appointment:   3 month(s)  The format for your next appointment:   In Person  Provider:   Cherlynn Kaiser, MD   Other Instructions

## 2019-08-18 ENCOUNTER — Ambulatory Visit: Payer: 59

## 2019-08-18 ENCOUNTER — Ambulatory Visit: Payer: 59 | Attending: Internal Medicine

## 2019-08-18 DIAGNOSIS — Z23 Encounter for immunization: Secondary | ICD-10-CM

## 2019-08-18 NOTE — Progress Notes (Signed)
   Covid-19 Vaccination Clinic  Name:  Dawn Daniels    MRN: [id number] DOB: 1966/02/10  08/18/2019  [name] was observed post Covid-19 immunization for 15 minutes without incident. She was provided with Vaccine Information Sheet and instruction to access the V-Safe system.   [name] was instructed to call 911 with any severe reactions post vaccine: Marland Kitchen Difficulty breathing  . Swelling of face and throat  . A fast heartbeat  . A bad rash all over body  . Dizziness and weakness   Immunizations Administered    Name Date Dose VIS Date Route   Pfizer COVID-19 Vaccine 08/18/2019  5:15 PM 0.3 mL 04/25/2019 Intramuscular   Manufacturer: [employer], [employer]   Lot: [id number]   [place]: [id number]

## 2019-11-06 ENCOUNTER — Ambulatory Visit: Payer: No Typology Code available for payment source | Admitting: Internal Medicine

## 2019-11-06 ENCOUNTER — Other Ambulatory Visit: Payer: Self-pay

## 2019-11-06 ENCOUNTER — Encounter: Payer: Self-pay | Admitting: Internal Medicine

## 2019-11-06 VITALS — BP 126/64 | HR 61 | Ht 67.0 in | Wt 202.2 lb

## 2019-11-06 DIAGNOSIS — R079 Chest pain, unspecified: Secondary | ICD-10-CM

## 2019-11-06 DIAGNOSIS — R002 Palpitations: Secondary | ICD-10-CM

## 2019-11-06 DIAGNOSIS — I10 Essential (primary) hypertension: Secondary | ICD-10-CM

## 2019-11-06 MED ORDER — VALSARTAN 160 MG PO TABS: 160.0000 mg | ORAL_TABLET | Freq: Every day | ORAL | 3 refills | Status: DC

## 2019-11-06 NOTE — Patient Instructions (Signed)
Medication Instructions:  NO CHANGES  medication refilled , primary to take over after completion *If you need a refill on your cardiac medications before your next appointment, please call your pharmacy*   Lab Work: NOT NEEDED.   Testing/Procedures: NOT NEEDED   Follow-Up: At [facility], you and your health needs are our priority.  As part of our continuing mission to provide you with exceptional heart care, we have created designated Provider Care Teams.  These Care Teams include your primary Cardiologist (physician) and Advanced Practice Providers (APPs -  Physician Assistants and Nurse Practitioners) who all work together to provide you with the care you need, when you need it.     Your next appointment:     AS NEEDED  The format for your next appointment:   Either In Person or Virtual  Provider:   You may see Dr Margaretann Loveless or one of the following Advanced Practice Providers on your designated Care Team:    Rosaria Ferries, PA-C  Jory Sims, DNP, ANP  Cadence Kathlen Mody, NP

## 2020-01-15 NOTE — Progress Notes (Signed)
Cardiology Office Note:    Date:  11/06/2019   ID:  Dawn, Daniels 1965-12-09, MRN [id number]  PCP:  Donald Prose, MD  Cardiologist:  No primary care provider on file.  Electrophysiologist:  None   Referring MD: Donald Prose, MD   Chief Complaint: chest pain, palpitations.  History of Present Illness:    Dawn Daniels is a 54 y.o. female with a history of HLD, hysterectomy for menometrorrhagia, and obesity who presents for follow up. She has palpitations and chest pain with negative ischemic workup and minimal CAD. She is noted to have exercise hypertension which we suspect is the cause of chest pain. Rare episodes of CP since last follow up though she has limited her activities.. Palpitations are likely related to sinus tachycardia and infrequent PACs. We started spironolactone for exercise related HTN, and metoprolol succinate 12.5 mg daily. This has helped her symptoms. Unable to uptitrate because of fatigue and bradycardia.  Past Medical History:  Diagnosis Date   Diastolic dysfunction without heart failure    Essential hypertension    Hyperlipidemia    Menometrorrhagia    a. s/p hysterectomy.   Obesity     Past Surgical History:  Procedure Laterality Date   ABDOMINAL HYSTERECTOMY     CESAREAN SECTION  1999, 2000   TUBAL LIGATION  1992   for ectopic pregnancy    WISDOM TOOTH EXTRACTION      Current Medications: Current Meds  Medication Sig   metoprolol tartrate (LOPRESSOR) 25 MG tablet Take 12.5 mg by mouth as needed.    spironolactone (ALDACTONE) 25 MG tablet Take 1 tablet (25 mg total) by mouth daily.   valsartan (DIOVAN) 160 MG tablet Take 1 tablet (160 mg total) by mouth daily.   [DISCONTINUED] valsartan (DIOVAN) 160 MG tablet Take 160 mg by mouth daily.   Current Facility-Administered Medications for the 11/06/19 encounter (Office Visit) with Elouise Munroe, MD  Medication   0.9 %  sodium chloride infusion     Allergies:   Patient  has no known allergies.   Social History   Tobacco Use   Smoking status: Former Smoker   Smokeless tobacco: Never Used   Tobacco comment: Smoked for 2 years as a teenager  Scientific laboratory technician Use: Never used  Substance Use Topics   Alcohol use: No   Drug use: No     Family History: The patient's family history includes Hypertension in her mother; Other in her father. There is no history of CAD, Colon cancer, Colon polyps, Esophageal cancer, Rectal cancer, or Stomach cancer.  ROS:   Please see the history of present illness.    All other systems reviewed and are negative.  EKGs/Labs/Other Studies Reviewed:    The following studies were reviewed today:  Recent Labs: No results found for requested labs within last 8760 hours.  Recent Lipid Panel No results found for: CHOL, TRIG, HDL, CHOLHDL, VLDL, LDLCALC, LDLDIRECT  Physical Exam:    VS:  BP 126/64    Pulse 61    Ht 5\' 7"  (1.702 m)    Wt 202 lb 3.2 oz (91.7 kg)    SpO2 99%    BMI 31.67 kg/m     Wt Readings from Last 5 Encounters:  11/06/19 202 lb 3.2 oz (91.7 kg)  07/28/19 201 lb 12.8 oz (91.5 kg)  06/25/19 190 lb (86.2 kg)  06/11/19 190 lb (86.2 kg)  05/22/17 199 lb (90.3 kg)    Constitutional: No acute distress  Eyes: sclera non-icteric, normal conjunctiva and lids ENMT: normal dentition, moist mucous membranes Cardiovascular: regular rhythm, normal rate, no murmurs. S1 and S2 normal. Radial pulses normal bilaterally. No jugular venous distention.  Respiratory: clear to auscultation bilaterally GI : normal bowel sounds, soft and nontender. No distention.   MSK: extremities warm, well perfused. No edema.  NEURO: grossly nonfocal exam, moves all extremities. PSYCH: alert and oriented x 3, normal mood and affect.   ASSESSMENT:    1. Chest pain, unspecified type   2. Palpitations   3. Essential hypertension    PLAN:    Chest pain, unspecified type - continue spironolactone for BP that is elevated with  exercise, and continue BB. Symptomatically improved.   Palpitations -continue BB, this has helped. Unable to uptitrate.   Essential hypertension - BP well controlled on spironolactone and BB.   CV risk reduction - discussed exercise recommendations.  Total time of encounter: 20 minutes total time of encounter, including 15 minutes spent in face-to-face patient care on the date of this encounter. This time includes coordination of care and counseling regarding above mentioned problem list. Remainder of non-face-to-face time involved reviewing chart documents/testing relevant to the patient encounter and documentation in the medical record. I have independently reviewed documentation from referring provider.   Cherlynn Kaiser, MD [place]   [facility]    Medication Adjustments/Labs and Tests Ordered: Current medicines are reviewed at length with the patient today.  Concerns regarding medicines are outlined above.  No orders of the defined types were placed in this encounter.  Meds ordered this encounter  Medications   valsartan (DIOVAN) 160 MG tablet    Sig: Take 1 tablet (160 mg total) by mouth daily.    Dispense:  90 tablet    Refill:  3    Patient Instructions  Medication Instructions:  NO CHANGES  medication refilled , primary to take over after completion *If you need a refill on your cardiac medications before your next appointment, please call your pharmacy*   Lab Work: NOT NEEDED.   Testing/Procedures: NOT NEEDED   Follow-Up: At [facility], you and your health needs are our priority.  As part of our continuing mission to provide you with exceptional heart care, we have created designated Provider Care Teams.  These Care Teams include your primary Cardiologist (physician) and Advanced Practice Providers (APPs -  Physician Assistants and Nurse Practitioners) who all work together to provide you with the care you need, when you need it.     Your next  appointment:     AS NEEDED  The format for your next appointment:   Either In Person or Virtual  Provider:   You may see Dr Margaretann Loveless or one of the following Advanced Practice Providers on your designated Care Team:    Rosaria Ferries, PA-C  Jory Sims, DNP, ANP  Cadence Kathlen Mody, NP

## 2020-05-17 ENCOUNTER — Other Ambulatory Visit: Payer: Self-pay | Admitting: Internal Medicine

## 2020-06-10 ENCOUNTER — Other Ambulatory Visit: Payer: Self-pay | Admitting: Internal Medicine

## 2020-07-19 ENCOUNTER — Telehealth: Payer: Self-pay | Admitting: Internal Medicine

## 2020-07-19 NOTE — Telephone Encounter (Signed)
07/19/20 spoke with patient and she states she does not wish to have this done at this time----will call back if she decides to have done.

## 2021-08-20 IMAGING — CT CT ANGIO CHEST
3 of 5 series · 17 of 36 positions shown · IV contrast (APPLIED)
Comparison: None.

CLINICAL DATA: 53-year-old female with history of aortic disease.
Suspected coarctation of the aorta. Atypical angina.

EXAM:
CT ANGIOGRAPHY CHEST WITH CONTRAST
TECHNIQUE: Multidetector CT imaging of the chest was performed using the
standard protocol during bolus administration of intravenous
contrast. Multiplanar CT image reconstructions and MIPs were
obtained to evaluate the vascular anatomy.
CONTRAST:  100mL OMNIPAQUE IOHEXOL 350 MG/ML SOLN

[Series 7: chest wo · axial · 0.57mm/px · z∈[-186,-24]mm · 8 of 105 slices shown]
[im 12/105  lung]
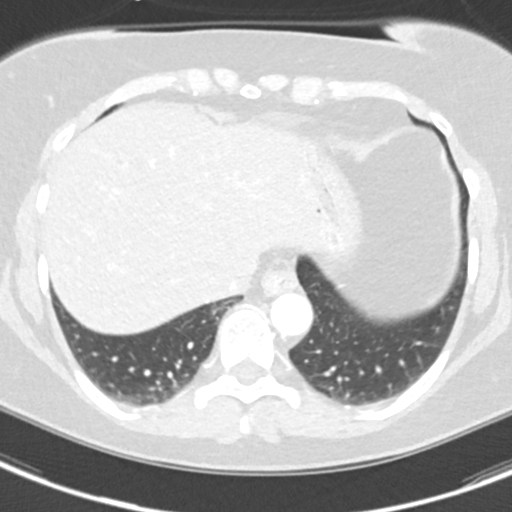
[im 24/105  mediastinal]
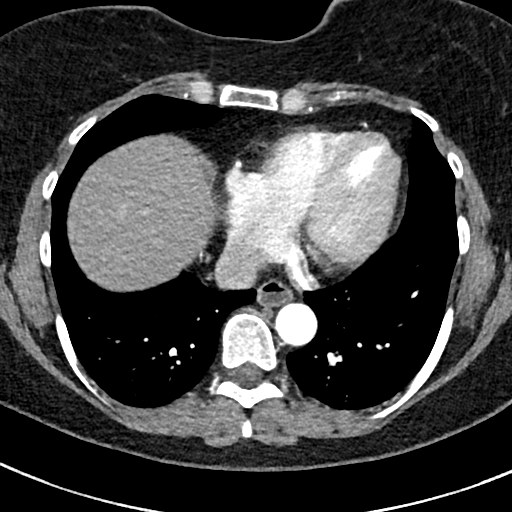
[im 35/105  lung]
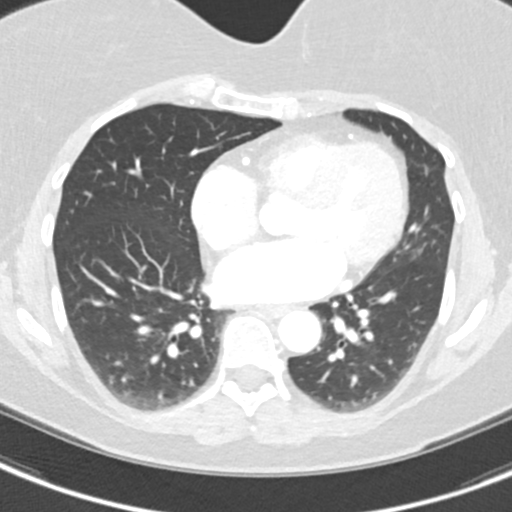
[im 47/105  mediastinal]
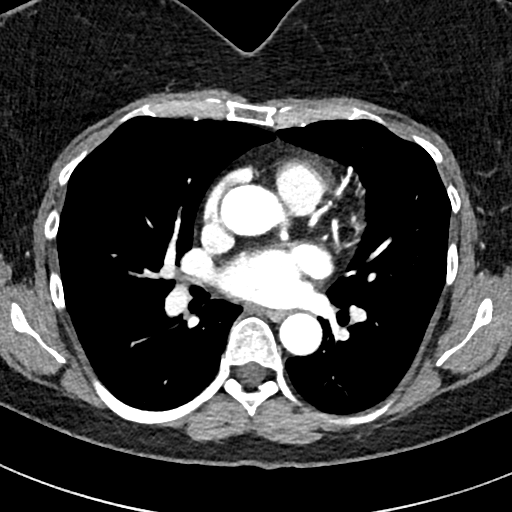
[im 58/105  lung]
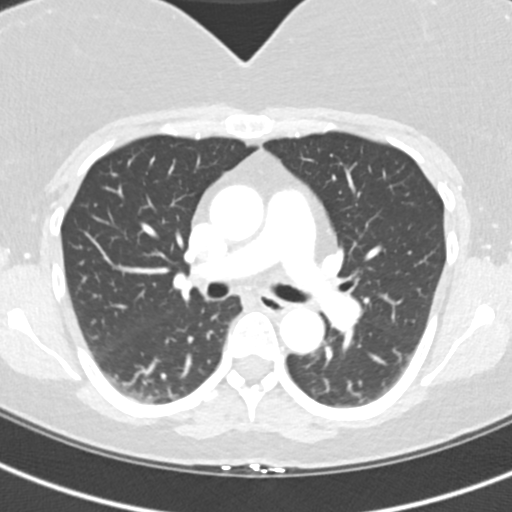
[im 70/105  mediastinal]
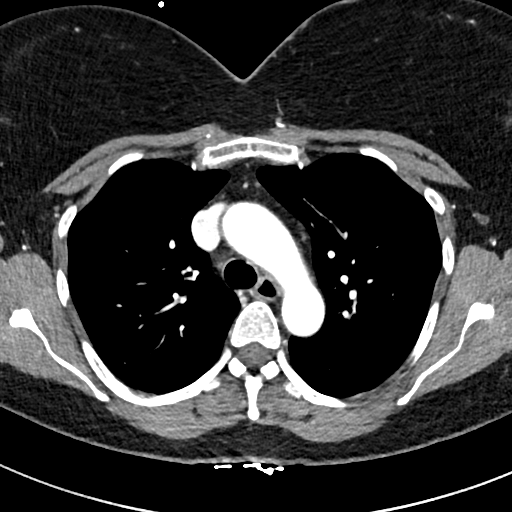
[im 81/105  lung]
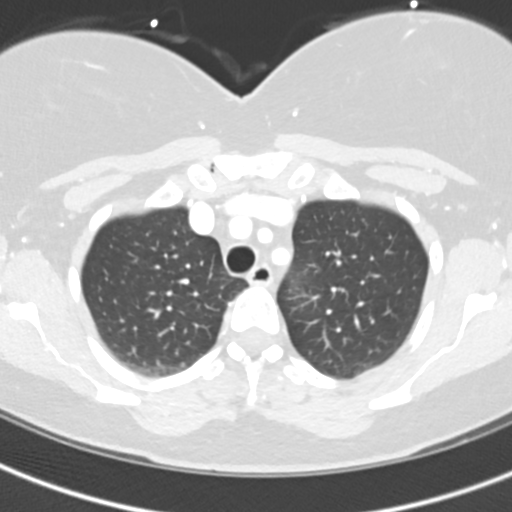
[im 93/105  mediastinal]
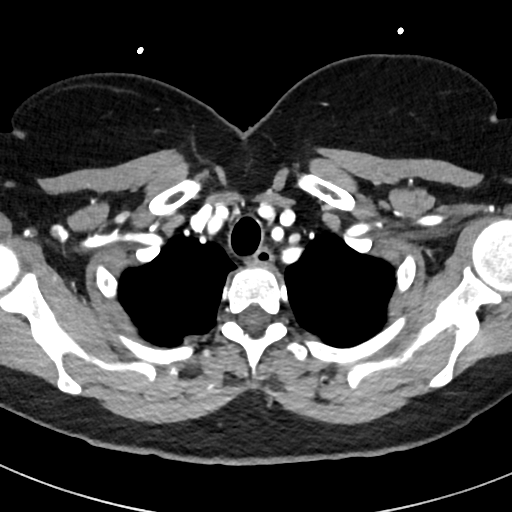

[Series 8: lungs · axial · 0.57mm/px · z∈[-186,-70]mm · 6 of 105 slices shown]
[im 12/105  lung]
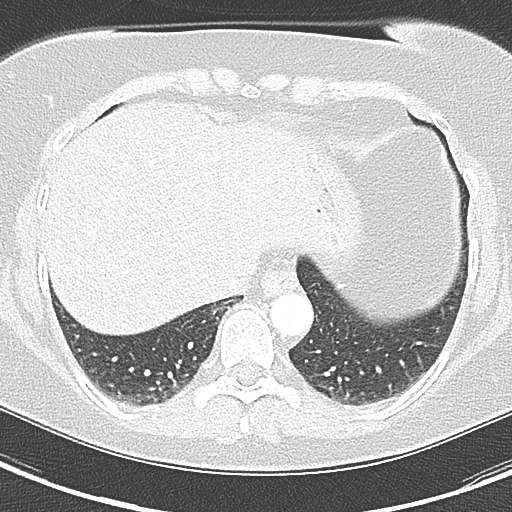
[im 24/105  lung]
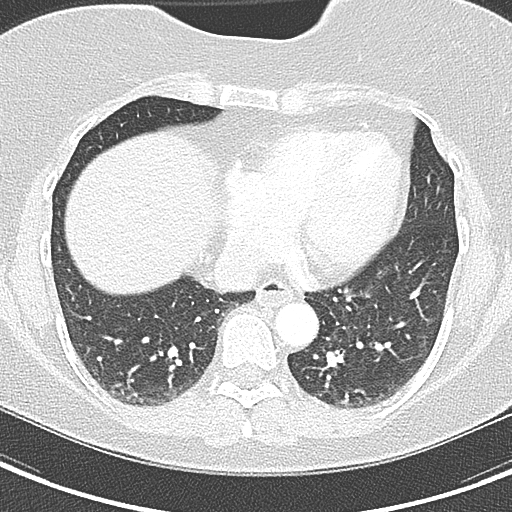
[im 35/105  lung]
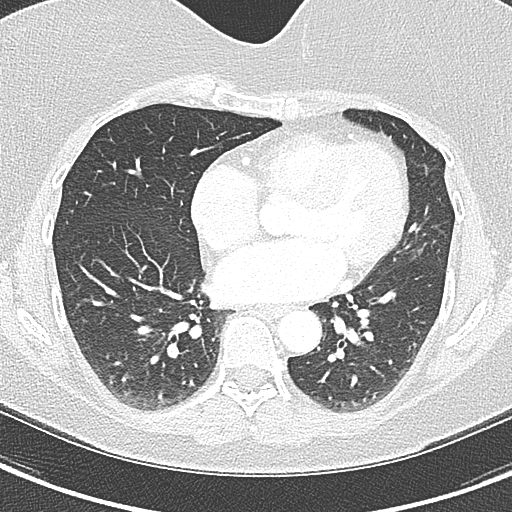
[im 47/105  lung]
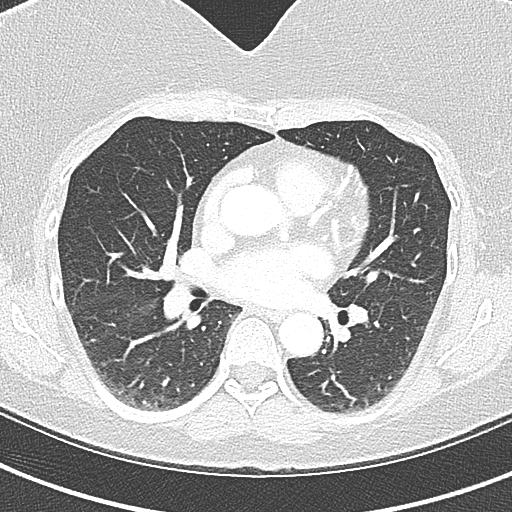
[im 58/105  lung]
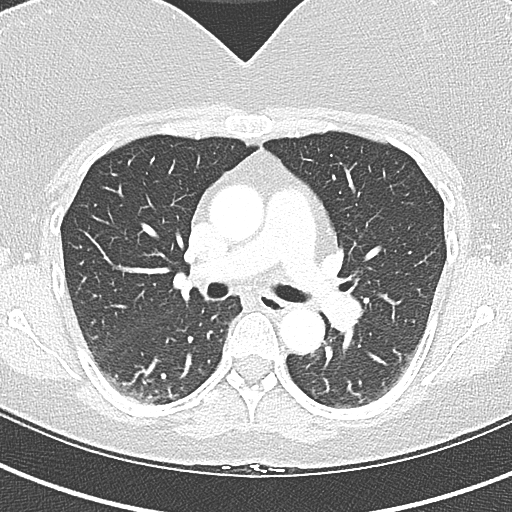
[im 70/105  lung]
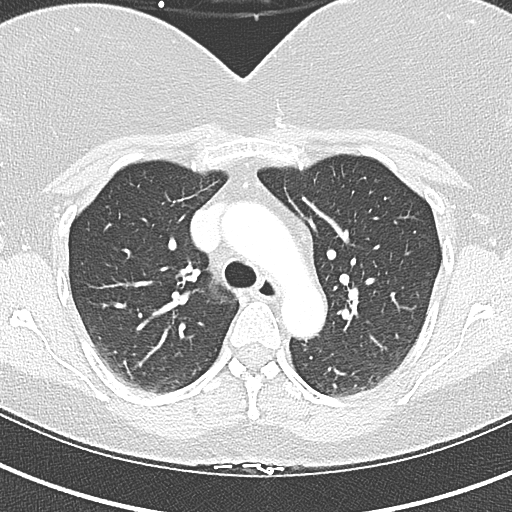

[Series 10: cor · coronal · 0.41mm/px · 3 of 115 slices shown]
[im 23/115  mediastinal]
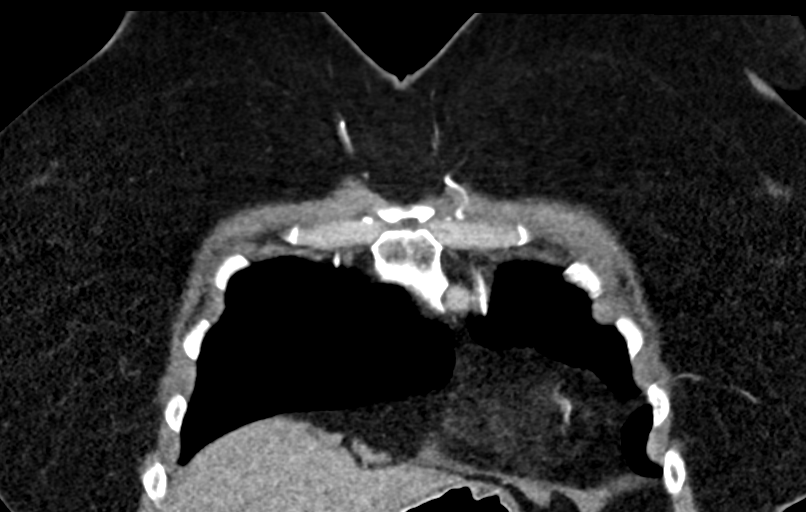
[im 46/115  mediastinal]
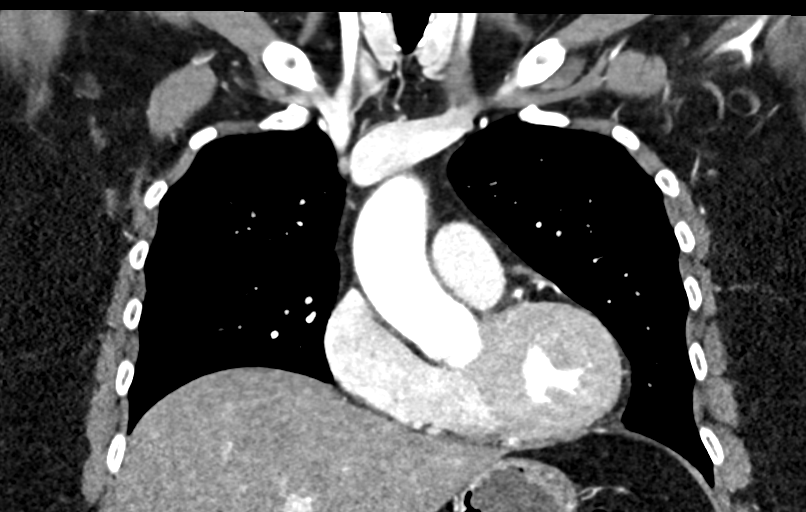
[im 69/115  mediastinal]
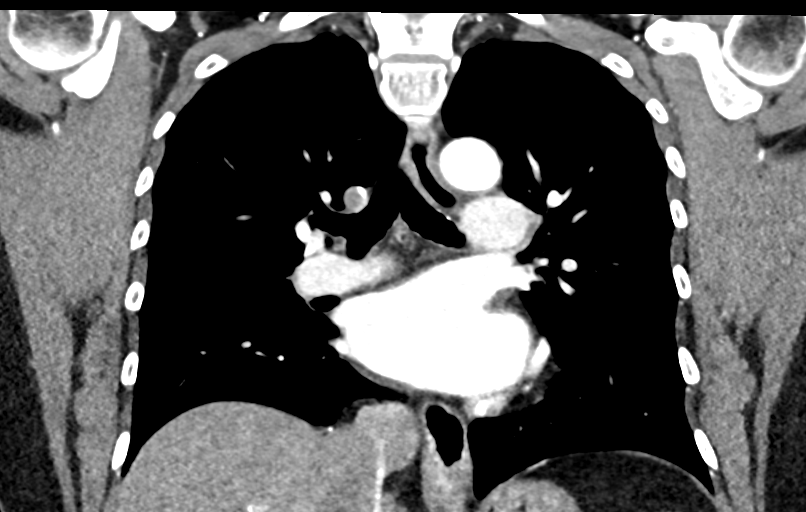

[17 of 36 positions shown; findings below may reference images not displayed]

FINDINGS: Cardiovascular: Heart size is normal. There is no significant
pericardial fluid, thickening or pericardial calcification. No
atherosclerotic calcifications in the thoracic aorta or the coronary
arteries. No aneurysm, dissection or coarctation of the thoracic
aorta.

Mediastinum/Nodes: No pathologically enlarged mediastinal or hilar
lymph nodes. Esophagus is unremarkable in appearance. No axillary
lymphadenopathy.

Lungs/Pleura: Multiple tiny 2-3 mm pulmonary nodules are noted
throughout the lungs bilaterally, nonspecific. No other larger more
suspicious appearing pulmonary nodules or masses are noted. No acute
consolidative airspace disease. No pleural effusions.

Upper Abdomen: Unremarkable.

Musculoskeletal: There are no aggressive appearing lytic or blastic
lesions noted in the visualized portions of the skeleton.

Review of the MIP images confirms the above findings.
IMPRESSION: 1. No evidence of aortic coarctation. No acute abnormality of the
thoracic aorta.
2. Multiple tiny 2-3 mm pulmonary nodules in the lungs bilaterally,
nonspecific, but statistically likely benign. No follow-up needed if
patient is low-risk (and has no known or suspected primary
neoplasm). Non-contrast chest CT can be considered in 12 months if
patient is high-risk. This recommendation follows the consensus
statement: Guidelines for Management of Incidental Pulmonary Nodules
Detected on CT Images: From the [HOSPITAL] 9313; Radiology

## 2021-08-20 IMAGING — CT CT HEART MORP W/ CTA COR W/ SCORE W/ CA W/CM &/OR W/O CM
4 of 6 series · 10 of 20 positions shown, 11 images · IV contrast (omnipaque)
Comparison: No priors.
COMPARISON: No priors.

Addendum:
EXAM:
OVER-READ INTERPRETATION  CT CHEST

The following report is an over-read performed by radiologist Dr.
Shankar Sams [[email or web address]] on 07/22/2019. This
over-read does not include interpretation of cardiac or coronary
anatomy or pathology. The coronary calcium score/coronary CTA
interpretation by the cardiologist is attached.
HISTORY: Chest pain, nonspecific chest pain
Cardiac/Coronary  CT
TECHNIQUE: The patient was scanned on a Siemens Force scanner.
PROTOCOL: A 120 kV prospective scan was triggered in the descending thoracic
aorta at 111 HU's. Axial non-contrast 3 mm slices were carried out
through the heart. The data set was analyzed on a dedicated work
station and scored using the Agatson method. Gantry rotation speed
was 250 msecs and collimation was .6 mm. Beta blockade and 0.8 mg of
sl NTG was given. The 3D data set was reconstructed in 5% intervals
of the 67-82 % of the R-R cycle. Diastolic phases were analyzed on a
dedicated work station using MPR, MIP and VRT modes. The patient
received 100mL OMNIPAQUE IOHEXOL 350 MG/ML SOLN of contrast.

[Series 6: best diast 73 % · axial · 0.39mm/px · z∈[-169,-133]mm · 2 of 273 slices shown]
[im 91/273  vessel]
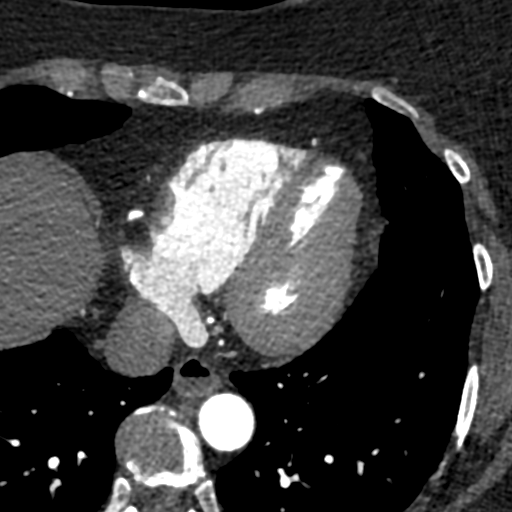
[im 182/273  vessel]
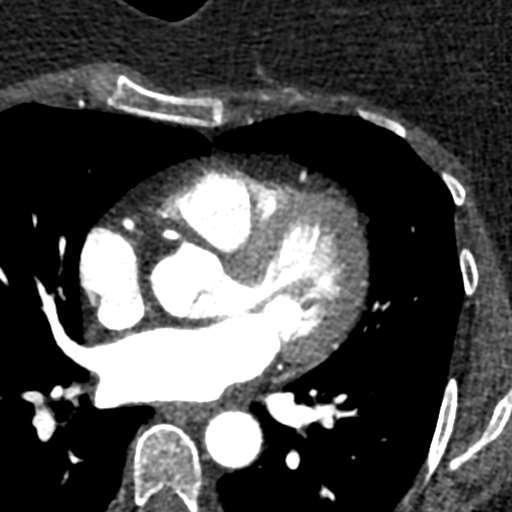

[Series 12: best syst · axial · 0.39mm/px · z∈[-169,-133]mm · 2 of 273 slices shown, 3 images]
[im 91/273  vessel]
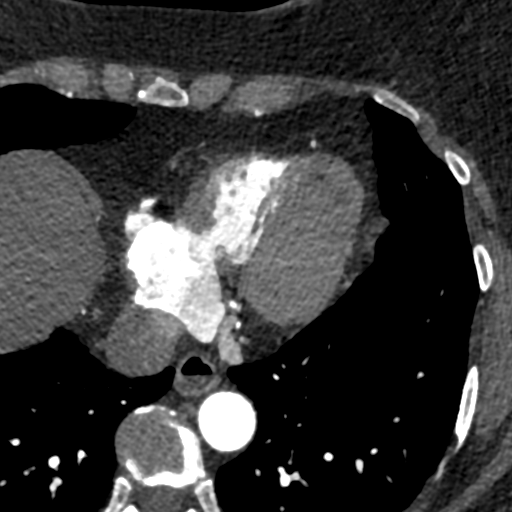
[im 91/273  lung]
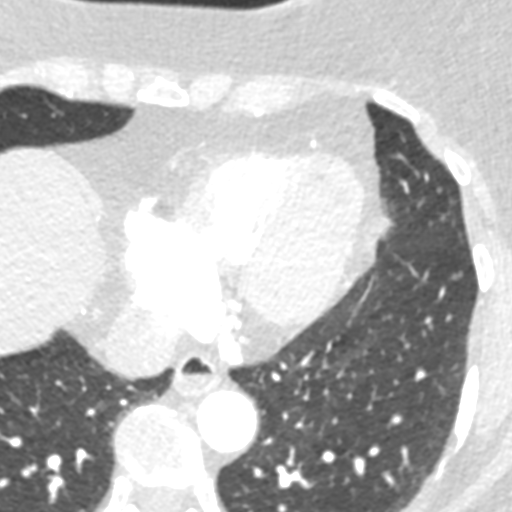
[im 182/273  vessel]
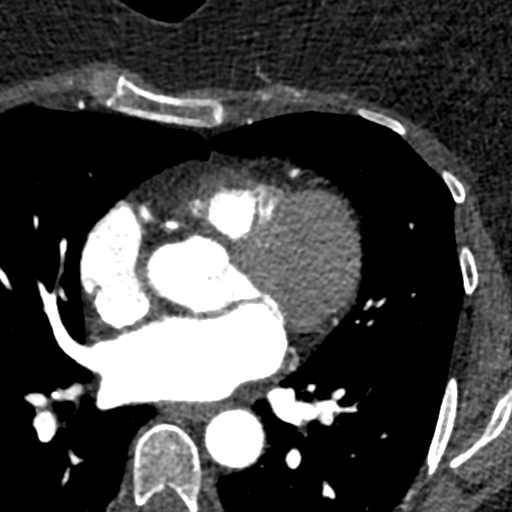

[Series 13: ts diast sharp · axial · 0.39mm/px · z∈[-178,-124]mm · 3 of 273 slices shown]
[im 69/273  lung]
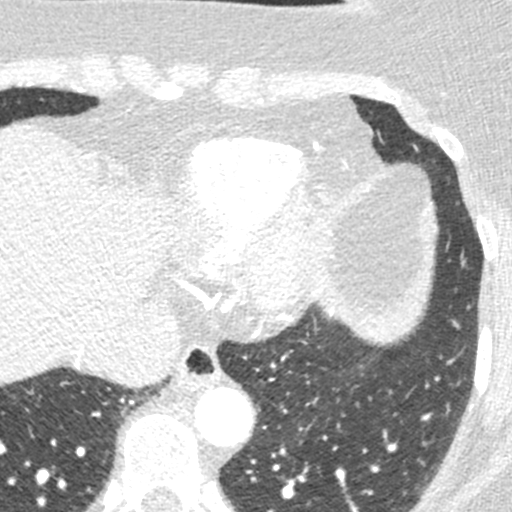
[im 137/273  lung]
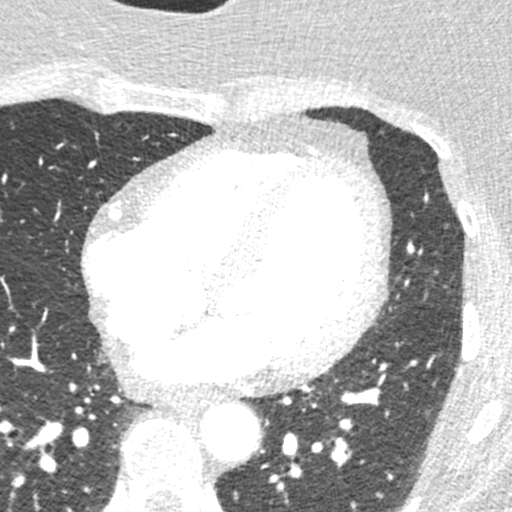
[im 205/273  lung]
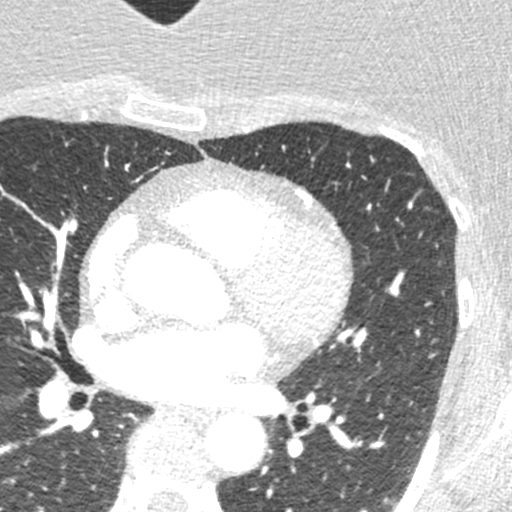

[Series 14: ts syst sharp · axial · 0.39mm/px · z∈[-178,-124]mm · 3 of 273 slices shown]
[im 69/273  lung]
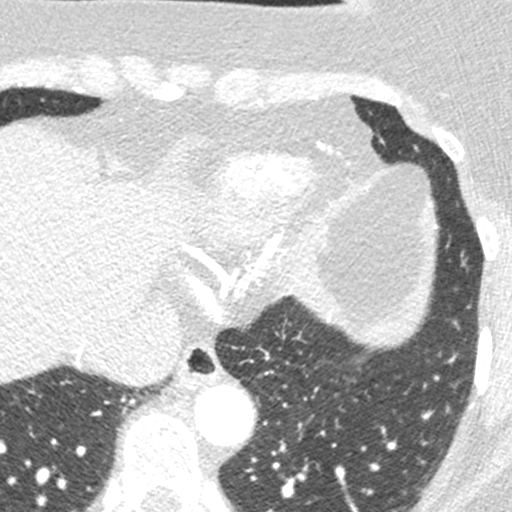
[im 137/273  lung]
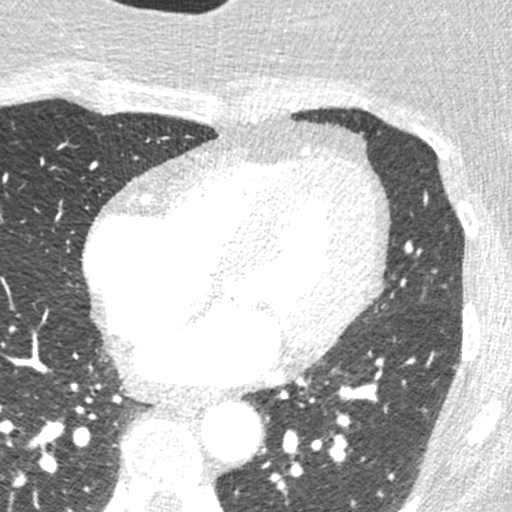
[im 205/273  lung]
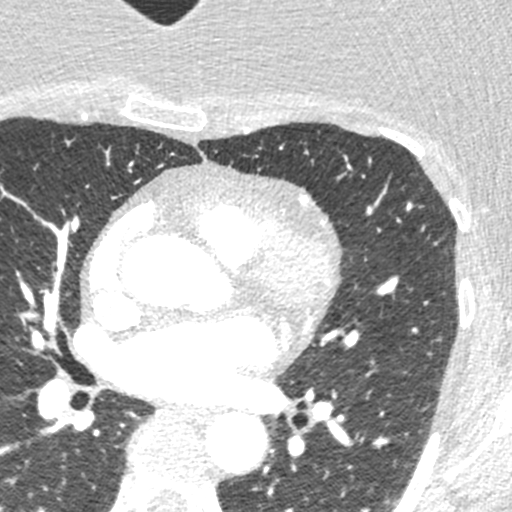

[10 of 20 positions shown; findings below may reference images not displayed]

FINDINGS: Extracardiac findings will be described separately under dictation
for contemporaneously obtained CTA chest.
IMPRESSION: Please see separate dictation for contemporaneously obtained CTA
chest for full description of relevant extracardiac findings.
FINDINGS: Coronary calcium score: The patient's coronary artery calcium score
is 0, which places the patient in the 0 percentile.

Coronary arteries: Normal coronary origins.  Right dominance.

Right Coronary Artery: Minimal atherosclerotic plaque in the
proximal RCA, <25% stenosis.

Left Main Coronary Artery: No detectable plaque or stenosis.

Left Anterior Descending Coronary Artery: No detectable plaque or
stenosis

Left Circumflex Artery: No detectable plaque or stenosis

Aorta: Normal size, 29 mm at the mid ascending aorta (level of the
PA bifurcation) measured double oblique. No calcifications. No
dissection.

Aortic Valve: No calcifications.

Other findings:

Normal pulmonary vein drainage into the left atrium.

Normal left atrial appendage without a thrombus.

Normal size of the pulmonary artery.
IMPRESSION: 1. Minimal CAD, CADRADS = 1.

2. Coronary calcium score of 0. This was 0 percentile for age and
sex matched control.

3. Normal coronary origin with right dominance.

*** End of Addendum ***
EXAM:
OVER-READ INTERPRETATION  CT CHEST

The following report is an over-read performed by radiologist Dr.
Shankar Sams [[email or web address]] on 07/22/2019. This
over-read does not include interpretation of cardiac or coronary
anatomy or pathology. The coronary calcium score/coronary CTA
interpretation by the cardiologist is attached.
FINDINGS: Extracardiac findings will be described separately under dictation
for contemporaneously obtained CTA chest.
IMPRESSION: Please see separate dictation for contemporaneously obtained CTA
chest for full description of relevant extracardiac findings.

## 2022-04-03 DIAGNOSIS — Z6830 Body mass index (BMI) 30.0-30.9, adult: Secondary | ICD-10-CM | POA: Diagnosis not present

## 2022-04-03 DIAGNOSIS — J209 Acute bronchitis, unspecified: Secondary | ICD-10-CM | POA: Diagnosis not present

## 2022-06-19 ENCOUNTER — Telehealth: Payer: Self-pay | Admitting: Internal Medicine

## 2022-06-19 NOTE — Telephone Encounter (Signed)
Spoke with pt and let her know that since she had a hx of precancerous polyps found on her last colon, all colonoscopies would be coded diagnostic from this point forward. She verbalized understanding.

## 2022-06-19 NOTE — Telephone Encounter (Signed)
Inbound call from patient requesting to speak with a nurse in regards her colonoscopy she want to know if colonoscopy is consider as preventing or diagnostic. Please advise .

## 2022-07-13 ENCOUNTER — Encounter: Payer: Self-pay | Admitting: Internal Medicine

## 2022-09-19 DIAGNOSIS — Z01419 Encounter for gynecological examination (general) (routine) without abnormal findings: Secondary | ICD-10-CM | POA: Diagnosis not present

## 2022-09-19 DIAGNOSIS — Z1231 Encounter for screening mammogram for malignant neoplasm of breast: Secondary | ICD-10-CM | POA: Diagnosis not present

## 2022-09-19 DIAGNOSIS — Z6831 Body mass index (BMI) 31.0-31.9, adult: Secondary | ICD-10-CM | POA: Diagnosis not present

## 2023-08-15 ENCOUNTER — Telehealth: Payer: Self-pay | Admitting: Internal Medicine

## 2023-08-15 NOTE — Telephone Encounter (Signed)
 Inbound call from patient requesting to speak with a nurse. She want to know if colonoscopy will be coded as preventing or diagnostic. Please advise .

## 2023-09-20 DIAGNOSIS — Z1272 Encounter for screening for malignant neoplasm of vagina: Secondary | ICD-10-CM | POA: Diagnosis not present

## 2023-09-20 DIAGNOSIS — Z01419 Encounter for gynecological examination (general) (routine) without abnormal findings: Secondary | ICD-10-CM | POA: Diagnosis not present

## 2023-09-20 DIAGNOSIS — Z1231 Encounter for screening mammogram for malignant neoplasm of breast: Secondary | ICD-10-CM | POA: Diagnosis not present

## 2023-09-20 DIAGNOSIS — Z6831 Body mass index (BMI) 31.0-31.9, adult: Secondary | ICD-10-CM | POA: Diagnosis not present

## 2023-09-26 DIAGNOSIS — E785 Hyperlipidemia, unspecified: Secondary | ICD-10-CM | POA: Diagnosis not present

## 2023-09-26 DIAGNOSIS — Z8249 Family history of ischemic heart disease and other diseases of the circulatory system: Secondary | ICD-10-CM | POA: Diagnosis not present

## 2023-09-26 DIAGNOSIS — Z87891 Personal history of nicotine dependence: Secondary | ICD-10-CM | POA: Diagnosis not present

## 2023-09-26 DIAGNOSIS — I10 Essential (primary) hypertension: Secondary | ICD-10-CM | POA: Diagnosis not present

## 2023-09-26 DIAGNOSIS — H269 Unspecified cataract: Secondary | ICD-10-CM | POA: Diagnosis not present

## 2023-09-26 DIAGNOSIS — Z6831 Body mass index (BMI) 31.0-31.9, adult: Secondary | ICD-10-CM | POA: Diagnosis not present

## 2023-09-26 DIAGNOSIS — R7303 Prediabetes: Secondary | ICD-10-CM | POA: Diagnosis not present

## 2023-09-26 DIAGNOSIS — E669 Obesity, unspecified: Secondary | ICD-10-CM | POA: Diagnosis not present

## 2023-10-04 ENCOUNTER — Encounter: Payer: Self-pay | Admitting: Internal Medicine

## 2023-10-16 DIAGNOSIS — E7800 Pure hypercholesterolemia, unspecified: Secondary | ICD-10-CM | POA: Diagnosis not present

## 2023-12-07 ENCOUNTER — Ambulatory Visit

## 2023-12-07 ENCOUNTER — Encounter: Payer: Self-pay | Admitting: Internal Medicine

## 2023-12-07 VITALS — Ht 66.0 in | Wt 195.0 lb

## 2023-12-07 DIAGNOSIS — Z860100 Personal history of colon polyps, unspecified: Secondary | ICD-10-CM

## 2023-12-07 MED ORDER — NA SULFATE-K SULFATE-MG SULF 17.5-3.13-1.6 GM/177ML PO SOLN: 1.0000 | Freq: Once | ORAL | 0 refills | Status: AC

## 2023-12-07 NOTE — Progress Notes (Signed)
 Pt's name and DOB verified at the beginning of the pre-visit wit 2 identifiers  Pt denies any difficulty with ambulating,sitting, laying down or rolling side to side  Pt has no issues moving head neck or swallowing  No egg or soy allergy known to patient   No issues known to pt with past sedation with any surgeries or procedures  No FH of Malignant Hyperthermia  Pt is not on home 02   Pt is not on blood thinners   Pt denies issues with constipation   Pt is not on dialysis  Pt hx trregular heart beats per pt and states no issues now  Pt denies any upcoming cardiac testing  Patient's chart reviewed by Norleen Schillings CNRA prior to pre-visit and patient appropriate for the [facility].  Pre-visit completed and red dot placed by patient's name on their procedure day (on provider's schedule).    Visit by phone  Pt states weight is 195 lb  IInstructions reviewed. Pt given  both [facility] main # and MD on call # prior to instructions.  Pt states understanding of instructions. Instructed pt to review instructions again prior to procedure and call main # given if has questions.. Pt states they will.   Instructed pt on where to find instructions on My Chart.

## 2023-12-25 ENCOUNTER — Ambulatory Visit (AMBULATORY_SURGERY_CENTER): Admitting: Internal Medicine

## 2023-12-25 ENCOUNTER — Encounter: Payer: Self-pay | Admitting: Internal Medicine

## 2023-12-25 VITALS — BP 108/50 | HR 60 | Resp 10 | Ht 66.0 in | Wt 195.0 lb

## 2023-12-25 DIAGNOSIS — I10 Essential (primary) hypertension: Secondary | ICD-10-CM | POA: Diagnosis not present

## 2023-12-25 DIAGNOSIS — Z1211 Encounter for screening for malignant neoplasm of colon: Secondary | ICD-10-CM | POA: Diagnosis not present

## 2023-12-25 DIAGNOSIS — Z860101 Personal history of adenomatous and serrated colon polyps: Secondary | ICD-10-CM | POA: Diagnosis not present

## 2023-12-25 DIAGNOSIS — K5730 Diverticulosis of large intestine without perforation or abscess without bleeding: Secondary | ICD-10-CM | POA: Diagnosis not present

## 2023-12-25 DIAGNOSIS — Z860100 Personal history of colon polyps, unspecified: Secondary | ICD-10-CM

## 2023-12-25 DIAGNOSIS — E669 Obesity, unspecified: Secondary | ICD-10-CM | POA: Diagnosis not present

## 2023-12-25 MED ORDER — SODIUM CHLORIDE 0.9 % IV SOLN: 500.0000 mL | Freq: Once | INTRAVENOUS | Status: DC

## 2023-12-25 NOTE — Progress Notes (Signed)
 PT taken to PACU. Monitors in place. VSS. Report given to RN.

## 2023-12-25 NOTE — Op Note (Signed)
 [facility] Patient Name: Dawn Daniels Procedure Date: 12/25/2023 9:59 AM MRN: [id number] Endoscopist: Gordy CHRISTELLA Starch , MD, [id number] Age: 58 Referring MD:  Date of Birth: 04/24/1966 Gender: Female Account #: [id number] Procedure:                Colonoscopy Indications:              High risk colon cancer surveillance: Personal                            history of non-advanced adenoma, Last colonoscopy:                            January 2019 (TA x 1) Medicines:                Monitored Anesthesia Care Procedure:                Pre-Anesthesia Assessment:                           - Prior to the procedure, a History and Physical                            was performed, and patient medications and                            allergies were reviewed. The patient's tolerance of                            previous anesthesia was also reviewed. The risks                            and benefits of the procedure and the sedation                            options and risks were discussed with the patient.                            All questions were answered, and informed consent                            was obtained. Prior Anticoagulants: The patient has                            taken no anticoagulant or antiplatelet agents. ASA                            Grade Assessment: II - A patient with mild systemic                            disease. After reviewing the risks and benefits,                            the patient was deemed in satisfactory condition to  undergo the procedure.                           After obtaining informed consent, the colonoscope                            was passed under direct vision. Throughout the                            procedure, the patient's blood pressure, pulse, and                            oxygen saturations were monitored continuously. The                            Olympus CF-HQ190L ([id number])  Colonoscope was                            introduced through the anus and advanced to the                            cecum, identified by appendiceal orifice and                            ileocecal valve. The quality of the bowel                            preparation was good. The ileocecal valve,                            appendiceal orifice, and rectum were photographed. Scope In: 10:09:46 AM Scope Out: 10:27:10 AM Scope Withdrawal Time: 0 hours 14 minutes 28 seconds  Total Procedure Duration: 0 hours 17 minutes 24 seconds  Findings:                 The digital rectal exam was normal.                           A few small-mouthed diverticula were found in the                            sigmoid colon and hepatic flexure.                           The exam was otherwise without abnormality on                            direct and retroflexion views. Complications:            No immediate complications. Estimated Blood Loss:     Estimated blood loss: none. Impression:               - Mild diverticulosis in the sigmoid colon and at                            the hepatic flexure.                           -  The examination was otherwise normal on direct                            and retroflexion views.                           - No specimens collected. Recommendation:           - Patient has a contact number available for                            emergencies. The signs and symptoms of potential                            delayed complications were discussed with the                            patient. Return to normal activities tomorrow.                            Written discharge instructions were provided to the                            patient.                           - Resume previous diet.                           - Continue present medications.                           - Repeat colonoscopy in 10 years for surveillance. Gordy CHRISTELLA Starch, MD 12/25/2023 10:29:17 AM This  report has been signed electronically.

## 2023-12-25 NOTE — Progress Notes (Signed)
 Pt's states no medical or surgical changes since previsit or office visit.

## 2023-12-25 NOTE — Patient Instructions (Signed)
 Resume previous diet. Continue present medications. Repeat colonoscopy in 10 years for surveillance.  Handout provided on diverticulosis.   YOU HAD AN ENDOSCOPIC PROCEDURE TODAY AT THE [facility]:   Refer to the procedure report that was given to you for any specific questions about what was found during the examination.  If the procedure report does not answer your questions, please call your gastroenterologist to clarify.  If you requested that your care partner not be given the details of your procedure findings, then the procedure report has been included in a sealed envelope for you to review at your convenience later.  YOU SHOULD EXPECT: Some feelings of bloating in the abdomen. Passage of more gas than usual.  Walking can help get rid of the air that was put into your GI tract during the procedure and reduce the bloating. If you had a lower endoscopy (such as a colonoscopy or flexible sigmoidoscopy) you may notice spotting of blood in your stool or on the toilet paper. If you underwent a bowel prep for your procedure, you may not have a normal bowel movement for a few days.  Please Note:  You might notice some irritation and congestion in your nose or some drainage.  This is from the oxygen used during your procedure.  There is no need for concern and it should clear up in a day or so.  SYMPTOMS TO REPORT IMMEDIATELY:  Following lower endoscopy (colonoscopy or flexible sigmoidoscopy):  Excessive amounts of blood in the stool  Significant tenderness or worsening of abdominal pains  Swelling of the abdomen that is new, acute  Fever of 100F or higher  For urgent or emergent issues, a gastroenterologist can be reached at any hour by calling [phone]. Do not use MyChart messaging for urgent concerns.    DIET:  We do recommend a small meal at first, but then you may proceed to your regular diet.  Drink plenty of fluids but you should avoid alcoholic beverages for 24  hours.  ACTIVITY:  You should plan to take it easy for the rest of today and you should NOT DRIVE or use heavy machinery until tomorrow (because of the sedation medicines used during the test).    FOLLOW UP: Our staff will call the number listed on your records the next business day following your procedure.  We will call around 7:15- 8:00 am to check on you and address any questions or concerns that you may have regarding the information given to you following your procedure. If we do not reach you, we will leave a message.     If any biopsies were taken you will be contacted by phone or by letter within the next 1-3 weeks.  Please call us  at [phone] if you have not heard about the biopsies in 3 weeks.    SIGNATURES/CONFIDENTIALITY: You and/or your care partner have signed paperwork which will be entered into your electronic medical record.  These signatures attest to the fact that that the information above on your After Visit Summary has been reviewed and is understood.  Full responsibility of the confidentiality of this discharge information lies with you and/or your care-partner.

## 2023-12-25 NOTE — Progress Notes (Signed)
 GASTROENTEROLOGY PROCEDURE H&P NOTE   Primary Care Physician: Sun, Vyvyan, MD    Reason for Procedure:  History of adenomatous colon polyp  Plan:    Colonoscopy  Patient is appropriate for endoscopic procedure(s) in the ambulatory ([facility]) setting.  The nature of the procedure, as well as the risks, benefits, and alternatives were carefully and thoroughly reviewed with the patient. Ample time for discussion and questions allowed. The patient understood, was satisfied, and agreed to proceed.     HPI: Dawn Daniels is a 58 y.o. female who presents for surveillance colonoscopy.  Medical history as below.  Tolerated the prep.  No recent chest pain or shortness of breath.  No abdominal pain today.  Past Medical History:  Diagnosis Date   Cataract    Diastolic dysfunction without heart failure    Essential hypertension    Hyperlipidemia    Menometrorrhagia    a. s/p hysterectomy.   Obesity    Post-operative nausea and vomiting     Past Surgical History:  Procedure Laterality Date   ABDOMINAL HYSTERECTOMY     CESAREAN SECTION  1999, 2000   COLONOSCOPY     TUBAL LIGATION  1992   for ectopic pregnancy    WISDOM TOOTH EXTRACTION      Prior to Admission medications   Medication Sig Start Date End Date Taking? Authorizing Provider  Cholecalciferol (D3) 25 MCG (1000 UT) capsule Take 1,000 Units by mouth daily.   Yes [provider]  irbesartan (AVAPRO) 150 MG tablet Take 150 mg by mouth daily.   Yes [provider]  rosuvastatin (CRESTOR) 5 MG tablet Take 5 mg by mouth daily. 11/13/23  Yes [provider]  metoprolol  tartrate (LOPRESSOR ) 25 MG tablet Take 12.5 mg by mouth as needed.     [provider]  spironolactone  (ALDACTONE ) 25 MG tablet Take 1 tablet (25 mg total) by mouth daily. 06/25/19 11/06/19  Acharya, Gayatri A, MD  valsartan  (DIOVAN ) 160 MG tablet Take 1 tablet (160 mg total) by mouth daily. 11/06/19   Acharya, Gayatri A, MD     Current Outpatient Medications  Medication Sig Dispense Refill   Cholecalciferol (D3) 25 MCG (1000 UT) capsule Take 1,000 Units by mouth daily.     irbesartan (AVAPRO) 150 MG tablet Take 150 mg by mouth daily.     rosuvastatin (CRESTOR) 5 MG tablet Take 5 mg by mouth daily.     metoprolol  tartrate (LOPRESSOR ) 25 MG tablet Take 12.5 mg by mouth as needed.      spironolactone  (ALDACTONE ) 25 MG tablet Take 1 tablet (25 mg total) by mouth daily. 90 tablet 3   valsartan  (DIOVAN ) 160 MG tablet Take 1 tablet (160 mg total) by mouth daily. 90 tablet 3   Current Facility-Administered Medications  Medication Dose Route Frequency Provider Last Rate Last Admin   0.9 %  sodium chloride  infusion  500 mL Intravenous Once Rahn Lacuesta, Gordy HERO, MD       0.9 %  sodium chloride  infusion  500 mL Intravenous Once Clotiel Troop, Gordy HERO, MD        Allergies as of 12/25/2023   (No Known Allergies)    Family History  Problem Relation Age of Onset   Hypertension Mother    Other Father        Father's history not known   CAD Neg Hx    Colon cancer Neg Hx    Colon polyps Neg Hx    Esophageal cancer Neg Hx  Rectal cancer Neg Hx    Stomach cancer Neg Hx     Social History   Socioeconomic History   Marital status: Married    Spouse name: Not on file   Number of children: 2   Years of education: 12   Highest education level: Not on file  Occupational History   Occupation: [employer]   Tobacco Use   Smoking status: Former   Smokeless tobacco: Never   Tobacco comments:    Smoked for 2 years as a teenager  Advertising account planner   Vaping status: Never Used  Substance and Sexual Activity   Alcohol use: No   Drug use: No   Sexual activity: Not on file  Other Topics Concern   Not on file  Social History Narrative   Not on file   Social Drivers of Health   Financial Resource Strain: Not on file  Food Insecurity: Not on file  Transportation Needs: Not on file  Physical Activity: Not on file  Stress: Not  on file  Social Connections: Not on file  Intimate Partner Violence: Not on file    Physical Exam: Vital signs in last 24 hours: @BP  (!) 176/85   Pulse 70   Ht 5' 6 (1.676 m)   Wt 195 lb (88.5 kg)   SpO2 99%   BMI 31.47 kg/m  GEN: NAD EYE: Sclerae anicteric ENT: MMM CV: Non-tachycardic Pulm: CTA b/l GI: Soft, NT/ND NEURO:  Alert & Oriented x 3   Gordy Starch, MD [facility]  12/25/2023 10:00 AM

## 2023-12-26 ENCOUNTER — Telehealth: Payer: Self-pay | Admitting: *Deleted

## 2023-12-26 NOTE — Telephone Encounter (Signed)
  Follow up Call-     12/25/2023    9:39 AM  Call back number  Post procedure Call Back phone  # [phone]  Permission to leave phone message Yes     Patient questions:  Do you have a fever, pain , or abdominal swelling? No. Pain Score  0 *  Have you tolerated food without any problems? Yes.    Have you been able to return to your normal activities? Yes.    Do you have any questions about your discharge instructions: Diet   No. Medications  No. Follow up visit  No.  Do you have questions or concerns about your Care? No.  Actions: * If pain score is 4 or above: No action needed, pain <4.

## 2024-02-21 DIAGNOSIS — E538 Deficiency of other specified B group vitamins: Secondary | ICD-10-CM | POA: Diagnosis not present

## 2024-02-28 DIAGNOSIS — E538 Deficiency of other specified B group vitamins: Secondary | ICD-10-CM | POA: Diagnosis not present

## 2024-03-07 DIAGNOSIS — E538 Deficiency of other specified B group vitamins: Secondary | ICD-10-CM | POA: Diagnosis not present

## 2024-03-13 DIAGNOSIS — E538 Deficiency of other specified B group vitamins: Secondary | ICD-10-CM | POA: Diagnosis not present
